# Patient Record
Sex: Male | Born: 1940
Health system: Southern US, Community
[De-identification: ages and names within clinical notes are randomized; demographics above are authoritative.]

## PROBLEM LIST (undated history)

## (undated) DIAGNOSIS — G4733 Obstructive sleep apnea (adult) (pediatric): Secondary | ICD-10-CM

## (undated) DIAGNOSIS — G4730 Sleep apnea, unspecified: Secondary | ICD-10-CM

## (undated) DIAGNOSIS — H579 Unspecified disorder of eye and adnexa: Secondary | ICD-10-CM

## (undated) DIAGNOSIS — Z87442 Personal history of urinary calculi: Secondary | ICD-10-CM

## (undated) DIAGNOSIS — Z9989 Dependence on other enabling machines and devices: Secondary | ICD-10-CM

## (undated) DIAGNOSIS — E785 Hyperlipidemia, unspecified: Secondary | ICD-10-CM

## (undated) DIAGNOSIS — M1990 Unspecified osteoarthritis, unspecified site: Secondary | ICD-10-CM

## (undated) DIAGNOSIS — J302 Other seasonal allergic rhinitis: Secondary | ICD-10-CM

## (undated) DIAGNOSIS — G4761 Periodic limb movement disorder: Secondary | ICD-10-CM

## (undated) DIAGNOSIS — I10 Essential (primary) hypertension: Secondary | ICD-10-CM

## (undated) DIAGNOSIS — R0902 Hypoxemia: Secondary | ICD-10-CM

## (undated) HISTORY — DX: Dependence on other enabling machines and devices: Z99.89

## (undated) HISTORY — DX: Hypoxemia: R09.02

## (undated) HISTORY — PX: JOINT REPLACEMENT: SHX530

## (undated) HISTORY — DX: Periodic limb movement disorder: G47.61

## (undated) HISTORY — PX: KNEE ARTHROSCOPY: SUR90

## (undated) HISTORY — DX: Obstructive sleep apnea (adult) (pediatric): G47.33

## (undated) HISTORY — DX: Unspecified disorder of eye and adnexa: H57.9

## (undated) HISTORY — PX: TOTAL HIP ARTHROPLASTY: SHX124

---

## 2002-12-10 ENCOUNTER — Encounter: Payer: Self-pay | Admitting: Specialist

## 2002-12-13 HISTORY — PX: TOTAL KNEE ARTHROPLASTY: SHX125

## 2002-12-21 ENCOUNTER — Inpatient Hospital Stay (HOSPITAL_COMMUNITY): Admission: RE | Admit: 2002-12-21 | Discharge: 2002-12-25 | Payer: Self-pay | Admitting: Specialist

## 2003-12-14 HISTORY — PX: TOTAL KNEE ARTHROPLASTY: SHX125

## 2003-12-16 ENCOUNTER — Inpatient Hospital Stay (HOSPITAL_COMMUNITY): Admission: EM | Admit: 2003-12-16 | Discharge: 2003-12-17 | Payer: Self-pay | Admitting: Emergency Medicine

## 2004-01-30 ENCOUNTER — Ambulatory Visit (HOSPITAL_COMMUNITY): Admission: RE | Admit: 2004-01-30 | Discharge: 2004-01-30 | Payer: Self-pay | Admitting: Specialist

## 2004-01-31 ENCOUNTER — Inpatient Hospital Stay (HOSPITAL_COMMUNITY): Admission: RE | Admit: 2004-01-31 | Discharge: 2004-02-04 | Payer: Self-pay | Admitting: Specialist

## 2009-08-26 ENCOUNTER — Encounter (INDEPENDENT_AMBULATORY_CARE_PROVIDER_SITE_OTHER): Payer: Self-pay | Admitting: *Deleted

## 2009-12-13 HISTORY — PX: SHOULDER ARTHROSCOPY: SHX128

## 2009-12-25 ENCOUNTER — Encounter (INDEPENDENT_AMBULATORY_CARE_PROVIDER_SITE_OTHER): Payer: Self-pay | Admitting: *Deleted

## 2009-12-31 ENCOUNTER — Ambulatory Visit: Payer: Self-pay | Admitting: Internal Medicine

## 2010-01-09 ENCOUNTER — Ambulatory Visit: Payer: Self-pay | Admitting: Internal Medicine

## 2010-10-15 ENCOUNTER — Ambulatory Visit (HOSPITAL_COMMUNITY): Admission: RE | Admit: 2010-10-15 | Discharge: 2010-10-16 | Payer: Self-pay | Admitting: Orthopedic Surgery

## 2010-12-13 HISTORY — PX: TOTAL SHOULDER ARTHROPLASTY: SHX126

## 2011-01-12 NOTE — Letter (Signed)
Summary: [facility] Instructions  [facility]  [address] [place], [place] [address]   Phone: [phone]  Fax: [phone]       Tommy [name]    1941-04-01    MRN: [id number]        Procedure Day /Date:  Friday 01/09/2010     Arrival Time:  8:30 am      Procedure Time: 9:30 am     Location of Procedure:                    _ x_  [facility] (4th Floor)  PREPARATION FOR COLONOSCOPY WITH MOVIPREP   Starting 5 days prior to your procedure Sunday 1/23  do not eat nuts, seeds, popcorn, corn, beans, peas,  salads, or any raw vegetables.  Do not take any fiber supplements (e.g. Metamucil, Citrucel, and Benefiber).  THE DAY BEFORE YOUR PROCEDURE         DATE: Thursday 1/27  1.  Drink clear liquids the entire day-NO SOLID FOOD  2.  Do not drink anything colored red or purple.  Avoid juices with pulp.  No orange juice.  3.  Drink at least 64 oz. (8 glasses) of fluid/clear liquids during the day to prevent dehydration and help the prep work efficiently.  CLEAR LIQUIDS INCLUDE: Water Jello Ice Popsicles Tea (sugar ok, no milk/cream) Powdered fruit flavored drinks Coffee (sugar ok, no milk/cream) Gatorade Juice: apple, white grape, white cranberry  Lemonade Clear bullion, consomm, broth Carbonated beverages (any kind) Strained chicken noodle soup Hard Candy                             4.  In the morning, mix first dose of MoviPrep solution:    Empty 1 Pouch A and 1 Pouch B into the disposable container    Add lukewarm drinking water to the top line of the container. Mix to dissolve    Refrigerate (mixed solution should be used within 24 hrs)  5.  Begin drinking the prep at 5:00 p.m. The MoviPrep container is divided by 4 marks.   Every 15 minutes drink the solution down to the next mark (approximately 8 oz) until the full liter is complete.   6.  Follow completed prep with 16 oz of clear liquid of your choice (Nothing red or purple).   Continue to drink clear liquids until bedtime.  7.  Before going to bed, mix second dose of MoviPrep solution:    Empty 1 Pouch A and 1 Pouch B into the disposable container    Add lukewarm drinking water to the top line of the container. Mix to dissolve    Refrigerate  THE DAY OF YOUR PROCEDURE      DATE: Friday 1/28  Beginning at 4:30 a.m. (5 hours before procedure):         1. Every 15 minutes, drink the solution down to the next mark (approx 8 oz) until the full liter is complete.  2. Follow completed prep with 16 oz. of clear liquid of your choice.    3. You may drink clear liquids until 7:30 am (2 HOURS BEFORE PROCEDURE).   MEDICATION INSTRUCTIONS  Unless otherwise instructed, you should take regular prescription medications with a small sip of water   as early as possible the morning of your procedure.    Additional medication instructions:  Do not take HCTZ morning of procedure.  OTHER INSTRUCTIONS  You will need a responsible adult at least 68 years of age to accompany you and drive you home.   This person must remain in the waiting room during your procedure.  Wear loose fitting clothing that is easily removed.  Leave jewelry and other valuables at home.  However, you may wish to bring a book to read or  an iPod/MP3 player to listen to music as you wait for your procedure to start.  Remove all body piercing jewelry and leave at home.  Total time from sign-in until discharge is approximately 2-3 hours.  You should go home directly after your procedure and rest.  You can resume normal activities the  day after your procedure.  The day of your procedure you should not:   Drive   Make legal decisions   Operate machinery   Drink alcohol   Return to work  You will receive specific instructions about eating, activities and medications before you leave.    The above instructions have been reviewed and explained to me by   Ezra Sites RN   January 68, 2011 4:56 PM     I fully understand and can verbalize these instructions _____________________________ Date _________

## 2011-01-12 NOTE — Miscellaneous (Signed)
Summary: [facility]  Clinical Lists Changes  Medications: Added new medication of MOVIPREP 100 GM  SOLR (PEG-KCL-NACL-NASULF-NA ASC-C) As per prep instructions. - Signed Rx of MOVIPREP 100 GM  SOLR (PEG-KCL-NACL-NASULF-NA ASC-C) As per prep instructions.;  #1 x 0;  Signed;  Entered by: Ezra Sites RN;  Authorized by: Hilarie Fredrickson MD;  Method used: Electronically to [facility] [address], [place], [place]  [id number], Ph: [phone], Fax: [phone] Observations: Added new observation of NKA: T (12/31/2009 16:32)    Prescriptions: MOVIPREP 100 GM  SOLR (PEG-KCL-NACL-NASULF-NA ASC-C) As per prep instructions.  #1 x 0   Entered by:   Ezra Sites RN   Authorized by:   Hilarie Fredrickson MD   Signed by:   Ezra Sites RN on 12/31/2009   Method used:   Electronically to        [facility]* (retail)       [address]       [place], [place]  [id number]       Ph: [phone]       Fax: [phone]   RxID:   [id number]

## 2011-01-12 NOTE — Procedures (Signed)
Summary: Colonoscopy  Patient: Tripton Ned Note: All result statuses are Final unless otherwise noted.  Tests: (1) Colonoscopy (COL)   COL Colonoscopy           DONE     [facility]     [address]     [place], [place]  [address]           COLONOSCOPY PROCEDURE REPORT           PATIENT:  Dimitrius, Steedman  MR#:  [id number]     BIRTHDATE:  Sep 02, 1941, 68 yrs. old  GENDER:  male           ENDOSCOPIST:  Wilhemina Bonito. Eda Keys, MD     Referred by:  Surveillance Program Recall           PROCEDURE DATE:  01/09/2010     PROCEDURE:  Surveillance Colonoscopy     ASA CLASS:  Class II     INDICATIONS:  history of polyps (index exam 08-2004 w/ one polyp     w/o path)           MEDICATIONS:   Fentanyl 75 mcg IV, Versed 7 mg IV           DESCRIPTION OF PROCEDURE:   After the risks benefits and     alternatives of the procedure were thoroughly explained, informed     consent was obtained.  Digital rectal exam was performed and     revealed no abnormalities.   The LB CF-H180AL [id number] endoscope     was introduced through the anus and advanced to the cecum, which     was identified by both the appendix and ileocecal valve, without     limitations.Time to cecum = 2:51 min.  The quality of the prep was     good, using MoviPrep.  The instrument was then slowly withdrawn     (time = 12:42 min) as the colon was fully examined.     <<PROCEDUREIMAGES>>           FINDINGS:  A normal appearing cecum, ileocecal valve, and     appendiceal orifice were identified. The ascending, hepatic     flexure, transverse, splenic flexure, descending, sigmoid colon,     and rectum appeared unremarkable.  No polyps or cancers were seen.     Retroflexed views in the rectum revealed no abnormalities.    The     scope was then withdrawn from the patient and the procedure     completed.           COMPLICATIONS:  None           ENDOSCOPIC IMPRESSION:     1) Normal colon     2) No polyps or cancers         RECOMMENDATIONS:     1) Continue current colorectal screening recommendations for     "routine risk" patients with a repeat colonoscopy in 10 years.           ______________________________     Wilhemina Bonito. Eda Keys, MD           CC:  Chilton Greathouse, MD,  The Patient           n.     eSIGNEDWilhemina Bonito. Eda Keys at 01/09/2010 10:14 AM           Einar Gip, [id number]  Note: An exclamation mark (!) indicates a result that was not dispersed into the  flowsheet. Document Creation Date: 01/09/2010 10:15 AM _______________________________________________________________________  (1) Order result status: Final Collection or observation date-time: 01/09/2010 10:09 Requested date-time:  Receipt date-time:  Reported date-time:  Referring Physician:   Ordering Physician: Fransico Setters [phone]) Specimen Source:  Source: Launa Grill Order Number: [id number] Lab site:   Appended Document: Colonoscopy    Clinical Lists Changes  Observations: Added new observation of COLONNXTDUE: 12/2019 (01/09/2010 14:05)

## 2011-02-23 LAB — APTT: aPTT: 36 seconds (ref 24–37)

## 2011-02-23 LAB — COMPREHENSIVE METABOLIC PANEL
AST: 25 U/L (ref 0–37)
Alkaline Phosphatase: 63 U/L (ref 39–117)
CO2: 30 mEq/L (ref 19–32)
Chloride: 99 mEq/L (ref 96–112)
Creatinine, Ser: 1.04 mg/dL (ref 0.4–1.5)
GFR calc Af Amer: >60 mL/min (ref >60)
GFR calc non Af Amer: >60 mL/min (ref >60)
Total Bilirubin: 0.6 mg/dL (ref 0.3–1.2)

## 2011-02-23 LAB — CBC
HCT: 48.3 % (ref 39.0–52.0)
Hemoglobin: 16.8 g/dL (ref 13.0–17.0)
MCH: 29.5 pg (ref 26.0–34.0)
MCHC: 34.8 g/dL (ref 30.0–36.0)
MCV: 84.7 fL (ref 78.0–100.0)
Platelets: 224 10*3/uL (ref 150–400)
RBC: 5.7 MIL/uL (ref 4.22–5.81)
RDW: 13.6 % (ref 11.5–15.5)
WBC: 8.5 10*3/uL (ref 4.0–10.5)

## 2011-02-23 LAB — URINALYSIS, ROUTINE W REFLEX MICROSCOPIC
Glucose, UA: NEGATIVE mg/dL
Hgb urine dipstick: NEGATIVE
Protein, ur: NEGATIVE mg/dL
Specific Gravity, Urine: 1.025 (ref 1.005–1.030)
pH: 6 (ref 5.0–8.0)

## 2011-04-30 NOTE — Op Note (Signed)
NAME:  Tommy [name], Tommy [name] NO.:  [id number]   MEDICAL RECORD NO.:  [id number]                   PATIENT TYPE:  INP   LOCATION:  0467                                 FACILITY:  [facility]   PHYSICIAN:  Erasmo Leventhal, M.D.         DATE OF BIRTH:  01/01/1941   DATE OF PROCEDURE:  02/01/2004  DATE OF DISCHARGE:                                 OPERATIVE REPORT   PREOPERATIVE DIAGNOSIS:  Right knee end-stage osteoarthritis.   POSTOPERATIVE DIAGNOSIS:  Right knee end-stage osteoarthritis.   PROCEDURE:  Right total knee arthroplasty.   SURGEON:  Erasmo Leventhal, M.D.   FIRST ASSISTANT:  Madlyn Frankel. Charlann Boxer, M.D.   SECOND ASSISTANT:  Mr. Khair Chasteen, P.A.-C.   ANESTHESIA:  Spinal epidural.   ESTIMATED BLOOD LOSS:  Less than 50 mL.   DRAINS:  Two medium Hemovac.   COMPLICATIONS:  None.   TOURNIQUET TIME:  1 hour 45 minutes at 350 mmHg.   DISPOSITION:  PACU stable.   OPERATIVE IMPLANTS:  Osteonics components, all cemented.  Size 9 femur, size  9 tibia, 2 mm flex tibial insert, and a 26 mm patella posterior-stabilized,  all cemented.   OPERATIVE DETAILS:  The patient had been counseled in the holding area, the  correct side was identified.  IV was started, antibiotics were given.  The  chart was reviewed and signed appropriately.  The correct side was  identified.  Taken to the OR and placed supine after a spinal epidural was  administered.  A Foley catheter was placed utilizing sterile technique by  the OR circulating nurse.  The right knee was examined.  He had a 7 degree  flexion contracture and flexion to 115 degrees.  He was elevated, prepped  with Duraprep, and all draped in a sterile fashion.  The other extremity was  well padded and bumped.   The right lower extremity was exsanguinated with the Esmarch, the tourniquet  was inflated to 350 mmHg.  He had a previous medial arthrotomy incision  approximately 18-20 years ago.  The skin flap  between was kept as wide as  possible.  A straight midline incision was made through the skin and  subcutaneous tissue, small veins electrocoagulated.  Medial and lateral soft  tissue flaps were developed.  Soft tissue was retracted.  A medial  parapatellar arthrotomy was performed.  A medial soft tissue release done on  the proximal medial tibia.  Found to have end-stage arthritic changes, bone-  against-bone contact.  The cruciate ligaments were resected, a starting hole  made with __________.  The canal was irrigated until the effluent was clear.  The intramedullary rod was gently placed.  I chose a 5 degree valgus cut  with a 10 mm cut off the distal femur.  The distal femur was found to be a  size #9.  Rotation marks were made and the distal femur was cut to fit a  size #9.  The meniscal remnants were removed with electrocautery.  Geniculate vessels  were coagulated.  Posterior neurovascular structures were followed and  protected throughout the entire case.  Large osteophytes across the medial  tibia with eburnated bone on the medial tibial plateau.  The tibial eminence  was resected.  The proximal tibia was found to be a size 9.  A starting hole  was made.  The step drill was utilized.  The canal was irrigated until the  effluent was clear.  The intramedullary rod was gently placed.  Chose a 0  degree slope with a 10 mm cut based upon the lateral side.  The proximal  tibia was cut at this time.   Posteromedial and posterolateral femoral osteophytes were removed under  direct visualization.  The femoral trochlea was prepared in standard  fashion.  At this time the size 9 femur, size 9 tibia, with a 10 mm flex  insert, excellent range of motion and soft tissue balance.  Rotation marks  were made.  The delta keel was performed in standard fashion.  Also note  that osteophytes were removed from the proximal medial tibia.  The patella  was found to be a size 26, reamed to the  appropriate level and excess bone  was removed.  At this time the knee was irrigated with pulsatile lavage  copiously.  Utilizing Modern cement technique, all components were cemented  into place, a size 9 tibia, size 9 femur, with a 26 mm patella after the  cement had cured with a 12 mm flex insert.  We had excellent range of  motion.  It was well-balanced in flexion and extension.  Patellofemoral  tracking was anatomic and did not require a lateral release.  At this time  the tibial trial was removed, bone wax placed on the exposed bony surfaces.  Excess cement had been removed earlier and a final 12 mm flex tibial  component was placed, posterior-stabilized.  The knee was well-balanced.  We  were very pleased at this time.  The knee joint, and also the soft tissue  was irrigated with antibiotic solution during the closure.  Two medium  Hemovac drains were placed.  The arthrotomy was closed with Vicryl and subcu  Vicryl.  Skin closed with subcuticular Monocryl sutures and Steri-Strips  were applied, the drain hooked to suction, sterile compressive dressing  applied.  The tourniquet was deflated and another gram of Ancef was given  intravenously.  Sponge and needle count were correct.  There were no  complications, and the patient was gently awakened and taken from the  operating room to PACU in stable condition.  Sponge and needle count were  correct.  No complications.                                               Erasmo Leventhal, M.D.    RAC/MEDQ  D:  01/31/2004  T:  02/01/2004  Job:  [id number]

## 2011-04-30 NOTE — H&P (Signed)
NAME:  Tommy [name], Tommy [name] NO.:  [id number]   MEDICAL RECORD NO.:  [id number]                   PATIENT TYPE:  INP   LOCATION:  0467                                 FACILITY:  [facility]   PHYSICIAN:  Erasmo Leventhal, M.D.         DATE OF BIRTH:  05/03/1941   DATE OF ADMISSION:  01/31/2004  DATE OF DISCHARGE:  02/04/2004                                HISTORY & PHYSICAL   This is a redictation of a lost history and physical from the chart.   REASON FOR ADMISSION:  Total knee arthroplasty, right knee.   BRIEF HISTORY:  This is a 70 year old gentleman with a long history of  osteoarthritis in both knees.  He has undergone previous left total knee  replacement and done well and is now scheduled for right total knee  replacement due to failure of conservative measures.  The surgery, risks,  benefits and __________ were discussed in detail with the patient and  questions were invited and answered. Surgery to go ahead as scheduled.   PAST MEDICAL HISTORY:  Drug allergies:  None.   CURRENT MEDICATIONS:  1. Norvasc 5 mg q.d.  2. Benazepril hydrochlorothiazide 40 mg 1 q.i.d.  3. Effexor 150 mg q.d.  4. Zocor 40 mg q.d.   PAST SURGICAL HISTORY:  Left total knee arthroplasty and right knee  meniscectomy.   SERIOUS MEDICAL ILLNESSES:  1. Hypertension.  2. Hypercholesterolemia.   FAMILY HISTORY:  Negative.   SOCIAL HISTORY:  The patient is married and has four children, works as a  [employer] and does not smoke or drink.   REVIEW OF SYMPTOMS:  HEENT:  Negative.  PULMONARY:  Negative.  CARDIOVASCULAR:  Negative.  GI:  Negative.  GU:  Positive for history of  kidney stones.   PHYSICAL EXAMINATION:  GENERAL:  This is a well-developed, well-nourished  gentleman in no acute distress.  VITAL SIGNS:  Blood pressure 138/85, respirations 16, heart rate 86,  temperature 97.9.  HEENT:  Head normocephalic, nose patent, ears patent.  Pupils equal round  and  reactive to light. Throat without injection.  NECK:  Supple without adenopathy. Carotids 2+ without bruit.  CHEST:  Clear to auscultation, no rales or rhonchi, respirations 86 beats  per minute.  ABDOMEN:  Soft with active bowel sounds, no mass or organomegaly.  NEUROLOGIC:  The patient alert and oriented to person, place and time.  Cranial nerves II-XII are grossly intact.  EXTREMITIES:  Left knee status post total knee arthroplasty, right knee with  varus deformity. Neurovascular status is intact.  X-rays show end-stage  osteoarthritis.   ASSESSMENT:  End-stage osteoarthritis, right knee.   PLAN:  Total knee replacement, right knee.     Jaquelyn Bitter. Chabon, P.A.                   Erasmo Leventhal, M.D.    SJC/MEDQ  D:  02/28/2004  T:  02/29/2004  Job:  [id number] 

## 2011-04-30 NOTE — Discharge Summary (Signed)
NAME:  Tommy [name], Tommy [name] NO.:  [id number]   MEDICAL RECORD NO.:  [id number]                   PATIENT TYPE:  INP   LOCATION:  0467                                 FACILITY:  [facility]   PHYSICIAN:  Erasmo Leventhal, M.D.         DATE OF BIRTH:  1941-10-06   DATE OF ADMISSION:  01/31/2004  DATE OF DISCHARGE:  02/04/2004                                 DISCHARGE SUMMARY   ADMISSION DIAGNOSIS:  Osteoarthritis right knee.   Dictation ended here.     Jaquelyn Bitter. Chabon, P.A.                   Erasmo Leventhal, M.D.    SJC/MEDQ  D:  02/28/2004  T:  02/29/2004  Job:  [id number]

## 2011-04-30 NOTE — H&P (Signed)
NAME:  Tommy [name], Tommy [name] NO.:  [id number]   MEDICAL RECORD NO.:  [id number]                   PATIENT TYPE:  INP   LOCATION:  NA                                   FACILITY:  [facility]   PHYSICIAN:  Erasmo Leventhal, M.D.         DATE OF BIRTH:  1941-09-30   DATE OF ADMISSION:  12/21/2001  DATE OF DISCHARGE:                                HISTORY & PHYSICAL   REASON FOR ADMISSION:  Total knee arthroplasty on December 21, 2002.   CHIEF COMPLAINT:  Left knee osteoarthritis.   HISTORY OF PRESENT ILLNESS:  This is a 70 year old gentleman with a long  history of osteoarthritis in his left and right knees, left worse than  right. He has had conservative treatment over the years with knee sleeve  therapy, injections, all of which have only given temporary relief in his  discomfort. Due to his increasing varus deformity and pain, the patient  requests total knee replacement. After a discussion of treatment options,  the patient is now for total knee replacement of the left knee. The surgery,  risks, benefits and aftercare, including infection, loosening, nerve or  blood vessel damage, phlebitis and death were discussed with the patient. He  requests to proceed with surgery and we will proceed with same.   ALLERGIES:  No known drug allergies.   CURRENT MEDICATIONS:  1. Effexor 150 mg 1 q.d.  2. Lotensin 40 mg 1 q.d.  3. Norvasc 5 mg 1 q.d.  4. Zocor 40 mg 1 q.d.   PAST SURGICAL HISTORY:  Right knee meniscectomy.   PAST MEDICAL HISTORY:  1. Hypertension.  2. Hypercholesteremia.   FAMILY HISTORY:  Negative.   SOCIAL HISTORY:  The patient is married. He has four children. He works as a  [employer]al architect. He does not smoke or drink.   REVIEW OF SYSTEMS:  HEENT: Negative for blurred vision or dizziness.  RESPIRATORY:  Negative for any PND or orthopnea. CARDIOVASCULAR:  Negative  for chest pain or palpitations. Positive for hypertension. GI:  Negative for  ulcers or hepatitis. GU:  Positive for a history of kidney stones.   PHYSICAL EXAMINATION:  VITAL SIGNS:  Blood pressure 130/90, respirations 18,  pulse 80 and regular.  HEENT:  Normocephalic. Nose patent. Pupils equally round and reactive to  light. Throat without injection.  NECK:  Supple without adenopathy. Carotids 2+ without bruits.  CHEST: Clear to auscultation, no rales or rhonchi, respirations 18.  HEART:  Regular rate and rhythm at 80 beats per minute without murmur.  ABDOMEN:  Soft with normoactive bowel sounds, no masses or organomegaly.  NEUROLOGIC:  The patient was  alert and oriented to time, place and person.  Cranial nerves II through XII grossly intact.  EXTREMITIES:  Within normal limits with the exception of the knees which  have varus deformities bilaterally, left greater than right. The left knee  has about a 3 degree flexion contracture with  further flexion to 120  degrees. He has pain with weightbearing and medial joint line pain. There is  crepitation throughout the range of motion.  Dorsalis pedis pulses and  posterior tibialis pulses are 2+ and sensibility is normal.   LABORATORY DATA:  X-rays revealed DJD with a varus deformity.   ASSESSMENT:  Degenerative joint disease, left knee.   PLAN:  Total knee replacement, left knee.                                               Erasmo Leventhal, M.D.    RAC/MEDQ  D:  12/12/2002  T:  12/12/2002  Job:  [id number]

## 2011-04-30 NOTE — Discharge Summary (Signed)
NAME:  ROBEN, SCHLIEP NO.:  [id number]   MEDICAL RECORD NO.:  [id number]                   PATIENT TYPE:  INP   LOCATION:  0467                                 FACILITY:  [facility]   PHYSICIAN:  Erasmo Leventhal, M.D.         DATE OF BIRTH:  [birth year]   DATE OF ADMISSION:  01/31/2004  DATE OF DISCHARGE:  02/04/2004                                 DISCHARGE SUMMARY   ADMITTING DIAGNOSIS:  End-stage osteoarthritis right knee.   DISCHARGE DIAGNOSIS:  End-stage osteoarthritis right knee.   OPERATION:  Total knee arthroplasty right knee.   BRIEF HISTORY:  This is a 70 year old gentleman with end-stage  osteoarthritis of his right knee who has failed conservative measures.  After discussion of the treatment options the patient is scheduled for a  total knee arthroplasty of the right knee.  Surgery risks, benefits, and  aftercare were discussed with the patient.  Surgery to go ahead as  scheduled.   LABORATORY VALUES:  Admission CBC within normal limits, admission PTT within  normal limits, hemoglobin/hematocrit reached a low of 13.1 and 37.6 on  February 03, 2004, PT reached 18.7 with an INR of 1.9 at discharge,  admission CMET within normal limits, he did have one episode of his glucose  reaching 155 otherwise normal, urinalysis did show some casts (both hyaline  and granular) and calcium oxalate crystals on January 28, 2004, repeat  urinalysis was normal.   COURSE IN THE HOSPITAL:  Patient tolerated the operative procedure well.  First postoperative day he had some pain but appeared comfortable, his vital  signs were stable, temperature 99.1, he did have elevated blood pressure at  180/106 but this was taken before he was restarted on his blood pressure  medication.  Second postoperative day his epidural was removed, his vital  signs were stable, pain was controlled, temperature was to a maximum of  100.4, hemoglobin and hematocrit were stable, he  was started on CPM and  physical therapy.  Third postoperative day he was feeling good, vital signs  were stable, temperature to a max of 100.5, dressing was changed, wounds  were benign, casts were negative, lungs were clear, heart sounds were  normal, bowel sounds active.  Fourth postoperative day vital signs remained  stable, temperature was 98.9, PT was 18.7 and INR 1.9, urine cultures were  negative, dressing was changed, wounds were benign, calf was nontender, and  the patient was subsequently discharged home for follow up in the office.   CONDITION ON DISCHARGE:  Improved.   DISCHARGE MEDICATIONS:  1. Robaxin 500 one p.o. q.8h. p.r.n. spasm.  2. Percocet one q.4-6h. p.r.n. pain.  3. Trinsicon one once daily.  4. Coumadin per pharmacy protocol.   DISCHARGE INSTRUCTIONS:  He is to do his home physical therapy, use his home  CPM, use his walker as instructed and return to see [place] in 10 days or sooner  p.r.n. problems.  Jaquelyn Bitter. Chabon, P.A.                   Erasmo Leventhal, M.D.    SJC/MEDQ  D:  02/19/2004  T:  02/20/2004  Job:  [id number]

## 2011-04-30 NOTE — Discharge Summary (Signed)
NAME:  Tommy [name], OAXACA NO.:  [id number]   MEDICAL RECORD NO.:  [id number]                   PATIENT TYPE:  INP   LOCATION:  0454                                 FACILITY:  [facility]   PHYSICIAN:  Erasmo Leventhal, M.D.         DATE OF BIRTH:  01/09/1941   DATE OF ADMISSION:  12/21/2002  DATE OF DISCHARGE:  12/25/2002                                 DISCHARGE SUMMARY   ADMISSION DIAGNOSIS:  Left knee osteoarthritis.   DISCHARGE DIAGNOSIS:  Left knee osteoarthritis.   OPERATION:  Total knee replacement left knee.   BRIEF HISTORY:  This is a 70 year old gentleman, long history of  osteoarthritis of the left and right knees, left worse than right.  He had  conservative treatment over the years with knee sweep therapy, injections,  all of which have only given him temporary relief of his discomfort.  Due to  increasing varus deformity and pain, the patient requests total knee  replacement.  After discussion of the treatment options the patient is  scheduled for total knee replacement.  The surgery, risks, benefits, and  aftercare were discussed in detail with the patient, and surgery is  scheduled.   LABORATORY VALUES:  Admission CBC within normal limits.  Admission complete  metabolic within normal limits.  The CBC reached a low of hemoglobin 12.9,  hematocrit 37.1 on December 23, 2002, and was back to 80 and 38 on December 24, 2002.  Discharge PT was 22.4 with an INR of 2.3.  Discharge BMET was  within normal limits with the exception of the glucose which was high at  141.   COURSE IN THE HOSPITAL:  The patient tolerated the operative procedure well.  First postoperative day vital signs were stable, he was afebrile.  He had  good motion in the knee.  Hemovac was pulled.  Hemoglobin and hematocrit  were stable.  The patient was doing well with pain control.  His epidural  was not keeping his pain under control and was subsequently pulled, and he  was switched to PCA and morphine.  His CPM was started, and he was to start  the Coumadin protocol that afternoon.  Postoperative day #2 vital signs were  stable, temperature was to 101.  No complaints.  Good appetite.  Hemoglobin  and hematocrit were stable.  Dressing was changed, and the wound was intact  and without difficulties.  He continued in therapy and CPM.  Postoperative  day #3 his vital signs were stable, he was afebrile.  Neurovascular status  was normal in the leg with normal circulation and sensation.  The wound was  benign.  Dressings were changed.  No calf tenderness or cords were noted.  The patient was up in PT and used the CPM.  On the fourth postoperative day  the patient was feeling good.  Vital signs were stable.  He was afebrile,  but the temperature was to 100.2 the  previous evening.  INR was 2.3 with PT  22.4.  The wound was slightly red at the distal margin.  There was no calf  tenderness or swelling, and the patient was subsequently ready for discharge  after set up of home PT, home health, and home monitoring of PT for  Coumadin.   CONDITION ON DISCHARGE:  Improved.   DISCHARGE MEDICATIONS:  1. Percocet 1-2 q.6h. p.r.n. pain.  2. Robaxin 500, 1 p.o. q.8h. p.r.n. spasm.  3. Keflex 250, 1 p.o. q.i.d. for five days.  4. Coumadin per pharmacy protocol.   DISCHARGE INSTRUCTIONS:  He is to undergo therapy at home, use his crutches.  Take no aspirin products.  Call if temperature or if wound showed any  redness or difficulties and/or return to the office in 10 days for  reevaluation or see [place] soon p.r.n. problems.     Jaquelyn Bitter. Chabon, P.A.                   Erasmo Leventhal, M.D.    SJC/MEDQ  D:  02/01/2003  T:  02/01/2003  Job:  [id number]

## 2011-04-30 NOTE — Op Note (Signed)
NAME:  Tommy [name], Tommy [name] NO.:  [id number]   MEDICAL RECORD NO.:  [id number]                   PATIENT TYPE:  INP   LOCATION:  0003                                 FACILITY:  [facility]   PHYSICIAN:  Erasmo Leventhal, M.D.         DATE OF BIRTH:  09-15-1941   DATE OF PROCEDURE:  12/21/2002  DATE OF DISCHARGE:                                 OPERATIVE REPORT   PREOPERATIVE DIAGNOSIS:  Left knee end-stage osteoarthritis.   POSTOPERATIVE DIAGNOSIS:  Left knee end-stage osteoarthritis.   PROCEDURE:  Left total knee arthroplasty.   SURGEON:  Erasmo Leventhal, M.D.   ASSISTANT:  Oneita Hurt, PA-C   ANESTHESIA:  Epidural with IV sedation.   ESTIMATED BLOOD LOSS:  Less than 100 cc.   DRAINS:  Two medium Hemovacs.   COMPLICATIONS:  None.   TOURNIQUET TIME:  1 hour and 55 minutes at 350 mmHg.   OPERATIVE IMPLANTS:  Osteonics components all cemented. Size 9 femur, size  11 tibia, 10 mm flex-type tibial insert with a 26-mm patella.   DESCRIPTION OF PROCEDURE:  The patient was counseled in the holding area,  correct sizes identified, IV antibiotics given. Taken to the OR where the  epidural was administered. A Foley catheter was placed utilizing sterile  technique by the OR circulating nurse. The left lower extremity was  examined. About 8 degrees of varus with a 5 degree flexion contracture and  flexed to 125 degrees. He was elevated, prepped with Duraprep, draped in a  sterile fashion. The leg was exsanguinated, tourniquet inflated to 350 mmHg.   A standard straight midline incision made in the skin and subcutaneous  tissue. Medial and lateral soft tissue flaps were developed at the  appropriate level. I made a soft tissue release from the proximal medial  tibia. The patella was everted, knee was flexed, end-stage arthritic  changes, bone against bone contact. The cruciate ligaments were resected. A  starting hole made in the distal femur,  irrigated, intermedullary rod was  gently placed. I chose a 5 degree valgus cut with a 10 mm cut off the distal  femur. The distal femur was found to be a size #9, replacement marks were  made and distal femur was cut to fit a size #9.   Medial and lateral osteophytes were removed from the tibia. The medial and  lateral menisci removed, geniculate vessels were coagulated, tibial eminence  was resected, proximal tibia was found to be a size #11. A starting hole was  made, step reamer utilized and a copious irrigation of the intermedullary  canal was done to prevent fat embolization. The intermedullary rod was  gently placed. I chose initially a 10 mm cut based upon the lateral side  which went in the direction of the defect on the medial side. The medial  postoperative osteophytes removed under direct visualization. The femoral  trochlea was prepared in standard fashion.   At this  time, a trial with a 9 femur, 11 tibia with a 10 mm insert was a  little bit tight in flexion extension, therefore these were removed and  another 2 mm taken from the proximal tibia. At this time with the 10 mm  insert, a 9 femur and 11 tibia we had excellent range of motion, well  balance in flexion extension and patella tracking was anatomic.   Rotation marks were made, alignment was excellent. Rotational marks were  made and a delta keel was performed.   The patella was found to be a size #26, it was of appropriate thickness. It  ranged to a depth of 10 mm, locking holes were made and excess bone was  removed. The knee was then irrigated with pulsatile lavage.   Utilizing modern cement technique, all components were then cemented into  place, a size 11 tibia, size 9 femur with a 26 patella. After the cement had  cured, all excess cement was removed. With a 10 mm flex-type trial, we had  excellent flexion-extension gaps, well balanced, patella tracking was  anatomic. The trial was removed, irrigated  thoroughly and the final implant  was a 10 mm Intraflex insert was placed. The geniculate vessel was  coagulated, bone wax placed to expose bony surfaces. Patella tracking was  anatomic.   Each side of the knee was irrigated with antibiotic solution during the  closure. The synovium closed with Vicryl, extensor mechanism was closed with  a Vicryl suture and the quadriceps tendon on two layers. The subcu closed  with Vicryl, skin closed with subcuticular Monocryl suture. Two medium  Hemovac drains placed prior to closure of the knee joint. Steri-Strips were  applied, sterile compressive dressing applied, tourniquet deflated and had  normal circulation in foot and ankle at the end of the case.  5 mg of IV  Ancef was given intravenously. There were no complications. Sponge and  needle count were correct. Ice pack and knee immobilizer applied. He was  taken from the operating room to PACU in stable condition. There were no  complications. Sponge and needle count were correct.   To decrease surgical time and help with retraction, Mr. Guerry Bruin  assisted me throughout this case.                                               Erasmo Leventhal, M.D.    RAC/MEDQ  D:  12/21/2002  T:  12/21/2002  Job:  [id number]

## 2011-10-04 ENCOUNTER — Encounter (HOSPITAL_COMMUNITY)
Admission: RE | Admit: 2011-10-04 | Discharge: 2011-10-04 | Disposition: A | Discharge status: Home / Self Care | Payer: BC Managed Care – PPO | Source: Ambulatory Visit | Attending: Orthopedic Surgery | Admitting: Orthopedic Surgery

## 2011-10-04 LAB — COMPREHENSIVE METABOLIC PANEL
Albumin: 4 g/dL (ref 3.5–5.2)
Alkaline Phosphatase: 67 U/L (ref 39–117)
BUN: 11 mg/dL (ref 6–23)
Creatinine, Ser: 0.96 mg/dL (ref 0.50–1.35)
GFR calc Af Amer: >90 mL/min (ref >90)
Glucose, Bld: 121 mg/dL — ABNORMAL HIGH (ref 70–99)
Total Bilirubin: 0.4 mg/dL (ref 0.3–1.2)
Total Protein: 7.5 g/dL (ref 6.0–8.3)

## 2011-10-04 LAB — URINE MICROSCOPIC-ADD ON

## 2011-10-04 LAB — SURGICAL PCR SCREEN: Staphylococcus aureus: POSITIVE — AB

## 2011-10-04 LAB — URINALYSIS, ROUTINE W REFLEX MICROSCOPIC
Leukocytes, UA: NEGATIVE
Nitrite: NEGATIVE
Specific Gravity, Urine: 1.021 (ref 1.005–1.030)
Urobilinogen, UA: 1 mg/dL (ref 0.0–1.0)
pH: 5.5 (ref 5.0–8.0)

## 2011-10-04 LAB — TYPE AND SCREEN
ABO/RH(D): O POS
Antibody Screen: NEGATIVE

## 2011-10-04 LAB — CBC
HCT: 47 % (ref 39.0–52.0)
Hemoglobin: 16.3 g/dL (ref 13.0–17.0)
MCV: 87.2 fL (ref 78.0–100.0)
RBC: 5.39 MIL/uL (ref 4.22–5.81)
WBC: 6.8 10*3/uL (ref 4.0–10.5)

## 2011-10-04 LAB — PROTIME-INR
INR: 0.98 (ref 0.00–1.49)
Prothrombin Time: 13.2 seconds (ref 11.6–15.2)

## 2011-10-07 ENCOUNTER — Inpatient Hospital Stay (HOSPITAL_COMMUNITY)
Admission: RE | Admit: 2011-10-07 | Discharge: 2011-10-08 | DRG: 491 | Disposition: A | Discharge status: Home / Self Care | Payer: BC Managed Care – PPO | Source: Ambulatory Visit | Attending: Orthopedic Surgery | Admitting: Orthopedic Surgery

## 2011-10-07 DIAGNOSIS — Z96659 Presence of unspecified artificial knee joint: Secondary | ICD-10-CM

## 2011-10-07 DIAGNOSIS — I10 Essential (primary) hypertension: Secondary | ICD-10-CM | POA: Diagnosis present

## 2011-10-07 DIAGNOSIS — Z79899 Other long term (current) drug therapy: Secondary | ICD-10-CM

## 2011-10-07 DIAGNOSIS — Z01812 Encounter for preprocedural laboratory examination: Secondary | ICD-10-CM

## 2011-10-07 DIAGNOSIS — Z96619 Presence of unspecified artificial shoulder joint: Secondary | ICD-10-CM

## 2011-10-07 DIAGNOSIS — G4733 Obstructive sleep apnea (adult) (pediatric): Secondary | ICD-10-CM | POA: Diagnosis present

## 2011-10-07 DIAGNOSIS — M19019 Primary osteoarthritis, unspecified shoulder: Principal | ICD-10-CM | POA: Diagnosis present

## 2011-10-12 NOTE — Op Note (Signed)
NAME:  KAPENA, HAMME NO.:  [id number]  MEDICAL RECORD NO.:  [id number]  LOCATION:  2899                         FACILITY:  [facility]  PHYSICIAN:  Vania Rea. Kendle Turbin, M.D.  DATE OF BIRTH:  06/10/1941  DATE OF PROCEDURE:  10/07/2011 DATE OF DISCHARGE:                              OPERATIVE REPORT   PREOPERATIVE DIAGNOSIS:  End-stage left shoulder glenohumeral osteoarthrosis.  POSTOPERATIVE DIAGNOSIS:  End-stage left shoulder glenohumeral osteoarthrosis.  PROCEDURE:  Left total shoulder arthroplasty utilizing a press-fit size 12 [employer] stem, a 48 pegged glenoid cemented and a 52 x 18 eccentric humeral head.  SURGEON:  Vania Rea. Trampus Mcquerry, MD  ASSISTANT:  Lucita Lora. Shuford, PA-C  ANESTHESIA:  General endotracheal as well as an interscalene block.  ESTIMATED BLOOD LOSS:  Approximately 200 mL.  DRAINS:  None.  HISTORY:  Mr. Hollerbach is a 70 year old gentleman who has had long- standing difficulties with both shoulders secondary to end-stage osteoarthritis status post a right total shoulder arthroplasty that we performed a number of months ago.  He now returns with ongoing difficulties related to his left shoulder end-stage gonarthrosis and today's plan for left total shoulder arthroplasty.  Preoperatively I counseled Mr. Bagnall on treatment options as well as risks versus benefits thereof.  Possible surgical complications were reviewed including potential for bleeding, infection, neurovascular injury, persistent pain, loss of motion, anesthetic complication, possible need for additional surgery.  He understands and accepts and agrees to planned procedure.  PROCEDURE IN DETAIL:  After undergoing routine preoperative evaluation, the patient received appropriate antibiotics and interscalene block was established in the holding area of the Anesthesia Department.  Placed supine on the operative table, underwent smooth induction of a general endotracheal  anesthesia.  Placed in beach-chair position and appropriately padded and protected.  Left shoulder girdle region was sterilely prepped and draped in standard fashion.  Time-out was called. Of note, Ralene Bathe, PA-C, surgical assistant throughout this case for assistance with the positioning of the extremity, holding retractors, exposure, suture management, wound closure, and intraoperative decision making was a vital part of the success of this procedure.  Anterior approach to the left shoulder was made to the deltopectoral interval.  Incision approximately 15 cm in length.  Dissection carried deeply and electrocautery was used for hemostasis.  The deltopectoral interval was identified with cephalic vein retracted laterally to the deltoid and this was developed from proximal to distal from the coracoid process down to the major tendon, the upper 1.5 cm which tenotomized to improve visualization.  Conjoined tendon was then mobilized, placed a self-retaining retractors to maintain the interval.  The biceps tendon was then elevated from the bicipital groove, tenotomized for later tenodesis.  We then electrocoagulated the anterior humeral circumflex vessels and then performed a tenotomy of the subscapularis leaving a lateral cuff approximately 2 cm for later repair, and also split the rotator interval to the base of the coracoid.  The free margin of the subscapularis was then tagged with a series of #2 FiberWire grasping sutures for later repair.  We then mobilized the subscapularis and retracted it medially.  The humeral head was then delivered through the wound and we divided the anterior inferior  capsule past the 6 o'clock back to the 8 o'clock position to allow complete delivery of the humeral head.  Using the extramedullary guide, we then outlined our proposed humeral head resection using oscillating saw to divide the humeral head, taking care to protect the rotator cuff.  Head was  then measured approximately 52 diameter.  We then removed the anterior and inferior osteophytes from the humeral head and neck.  Rotator cuff was found to be intact superiorly and posteriorly.  At this point, a series of hand reamings was performed opening the humeral canal up to size 12 and then broached up to size 12 maintaining approximately 30 degrees of retroversion.  Overall construct and alignment to our satisfaction.  We then placed a metal cap over the cut surface of the proximal humerus and then we went ahead and exposed the glenoid using a Fukuda retractor posteriorly, pitchfork anteriorly, and snake tongue superiorly and inferiorly as needed.  We are able to perform a circumferential labral excision with removal of the proximal stump of the biceps as well, gained complete exposure of the circumference of the glenoid.  This was measured with a 48 and provided the best coverage.  Central guide pin was then placed.  We performed a reaming with 48 reamer down to subchondral bone, and then the central drill hole was placed.  We then used the proper guide to place the peripheral peg holes overall as much to our satisfaction.  Trial glenoid showed excellent fit.  At this point, the glenoid was irrigated.  Cement was mixed and at the appropriate consistency, introduced into the peripheral peg holes and the final glenoid was impacted into position obtaining excellent fit and purchase.  Cement was allowed to harden.  At this point, we then carefully re-delivered the humeral head to the wound and implanted our size 12 humeral stem, which obtained excellent bony purchase and fixation.  We then performed trial reductions and the 52 x 18 eccentric head had the best coverage of the proximal humerus and this was what we ultimately selected and this did allow proper excursion of the humeral head across the glenoid.  The final 52 x 18 eccentric was impacted after the [facility] taper was  meticulously cleaned.  Final reduction was performed and overall soft tissue balance and mobility of the shoulder was very much to our satisfaction.  At this point, the subscapularis was then repaired to the lesser tuberosity through the residual soft tissue stump that had been carefully preserved and this was performed utilizing #2 FiberWires and overall soft tissue balance was much to our satisfaction, and we copiously irrigated joint prior to the final closure.  We then reclosed the rotator interval superiorly with figure-of-eight #2 FiberWires.  The shoulder was then taken through range of motion showing excellent mobility with easy external rotation to 30 degrees.  At this point, the deltopectoral interval was then irrigated to allow to reapproximate and closed with 2-0 Vicryl in the subcu layer and intracuticular 3-0 Monocryl for the skin followed by Steri-Strips.  Dry dressing was then taped to the left shoulder.  Left arm was placed in a sling immobilizer.  The patient was then awakened, extubated, and taken to the recovery room in a stable condition.     Vania Rea. Lavona Norsworthy, M.D.     KMS/MEDQ  D:  10/07/2011  T:  10/07/2011  Job:  [id number]  Electronically Signed by Francena Hanly M.D. on 10/12/2011 05:05:22 PM

## 2013-01-20 ENCOUNTER — Encounter (HOSPITAL_COMMUNITY): Payer: Self-pay | Admitting: Emergency Medicine

## 2013-01-20 ENCOUNTER — Emergency Department (HOSPITAL_COMMUNITY): Payer: Worker's Compensation

## 2013-01-20 ENCOUNTER — Emergency Department (HOSPITAL_COMMUNITY)
Admission: EM | Admit: 2013-01-20 | Discharge: 2013-01-20 | Disposition: A | Discharge status: Home / Self Care | Payer: Worker's Compensation | Attending: Emergency Medicine | Admitting: Emergency Medicine

## 2013-01-20 DIAGNOSIS — S2249XA Multiple fractures of ribs, unspecified side, initial encounter for closed fracture: Secondary | ICD-10-CM | POA: Insufficient documentation

## 2013-01-20 DIAGNOSIS — W1789XA Other fall from one level to another, initial encounter: Secondary | ICD-10-CM | POA: Insufficient documentation

## 2013-01-20 DIAGNOSIS — Z87891 Personal history of nicotine dependence: Secondary | ICD-10-CM | POA: Insufficient documentation

## 2013-01-20 DIAGNOSIS — S59909A Unspecified injury of unspecified elbow, initial encounter: Secondary | ICD-10-CM | POA: Insufficient documentation

## 2013-01-20 DIAGNOSIS — Y939 Activity, unspecified: Secondary | ICD-10-CM | POA: Insufficient documentation

## 2013-01-20 DIAGNOSIS — Z79899 Other long term (current) drug therapy: Secondary | ICD-10-CM | POA: Insufficient documentation

## 2013-01-20 DIAGNOSIS — Z96619 Presence of unspecified artificial shoulder joint: Secondary | ICD-10-CM | POA: Insufficient documentation

## 2013-01-20 DIAGNOSIS — S2232XA Fracture of one rib, left side, initial encounter for closed fracture: Secondary | ICD-10-CM

## 2013-01-20 DIAGNOSIS — I10 Essential (primary) hypertension: Secondary | ICD-10-CM | POA: Insufficient documentation

## 2013-01-20 DIAGNOSIS — Y929 Unspecified place or not applicable: Secondary | ICD-10-CM | POA: Insufficient documentation

## 2013-01-20 DIAGNOSIS — S6990XA Unspecified injury of unspecified wrist, hand and finger(s), initial encounter: Secondary | ICD-10-CM | POA: Insufficient documentation

## 2013-01-20 DIAGNOSIS — L989 Disorder of the skin and subcutaneous tissue, unspecified: Secondary | ICD-10-CM | POA: Insufficient documentation

## 2013-01-20 HISTORY — DX: Essential (primary) hypertension: I10

## 2013-01-20 HISTORY — DX: Sleep apnea, unspecified: G47.30

## 2013-01-20 MED ORDER — KETOROLAC TROMETHAMINE 60 MG/2ML IM SOLN
60.0000 mg | Freq: Once | INTRAMUSCULAR | Status: AC
  Administered 2013-01-20: 60 mg via INTRAMUSCULAR
  Filled 2013-01-20: qty 2

## 2013-01-20 MED ORDER — IBUPROFEN 800 MG PO TABS: 800.0000 mg | ORAL_TABLET | Freq: Three times a day (TID) | ORAL | Status: DC

## 2013-01-20 MED ORDER — OXYCODONE-ACETAMINOPHEN 5-325 MG PO TABS: 2.0000 | ORAL_TABLET | ORAL | Status: DC | PRN

## 2013-01-20 NOTE — ED Notes (Signed)
Pt fell off bed of flat bed truck yesterday, about 4 ft., landing on concrete surface. No loss of consciousness. Pt here today d/t left sided rib and side pain. Both left extremities are sore but has good ROM.

## 2013-01-20 NOTE — ED Provider Notes (Signed)
History     CSN: [id number]  Arrival date & time 01/20/13  1545   First MD Initiated Contact with Patient 01/20/13 1659      Chief Complaint  Patient presents with  . Fall    (Consider location/radiation/quality/duration/timing/severity/associated sxs/prior treatment) The history is provided by the patient.  Tommy [name] is a 72 y.o. male history of hypertension, bilateral shoulder surgeries in the past here with status post fall. He was on his truck yesterday and fell about 4 feet onto a concrete surface and landed on his left side. No head injury or LOC. He said the pain got worse this morning so he came in. He has more pain in the left ribs as well as his elbows. No nausea vomiting or abdominal pain.    Past Medical History  Diagnosis Date  . Hypertension   . Sleep apnea     Past Surgical History  Procedure Laterality Date  . Joint replacement      bilateral knee replacemts  . Joint replacement      bilateral shoulder replacemts    No family history on file.  History  Substance Use Topics  . Smoking status: Former Smoker    Quit date: 01/20/1983  . Smokeless tobacco: Never Used  . Alcohol Use: No      Review of Systems  Musculoskeletal:       L rib pain and elbow pain   All other systems reviewed and are negative.    Allergies  Review of patient's allergies indicates no known allergies.  Home Medications   Current Outpatient Rx  Name  Route  Sig  Dispense  Refill  . amLODipine (NORVASC) 10 MG tablet   Oral   Take 10 mg by mouth every morning.         Marland Kitchen atorvastatin (LIPITOR) 20 MG tablet   Oral   Take 20 mg by mouth every evening.         . benazepril (LOTENSIN) 40 MG tablet   Oral   Take 40 mg by mouth every morning.         . fluticasone (FLONASE) 50 MCG/ACT nasal spray   Nasal   Place 2 sprays into the nose daily.         Marland Kitchen venlafaxine XR (EFFEXOR-XR) 75 MG 24 hr capsule   Oral   Take 225 mg by mouth every morning.            BP 139/89  Pulse 82  Temp(Src) 98.7 F (37.1 C) (Oral)  Resp 22  SpO2 95%  Physical Exam  Nursing note and vitals reviewed. Constitutional: He is oriented to person, place, and time. He appears well-developed and well-nourished.  HENT:  Head: Normocephalic and atraumatic.  Mouth/Throat: Oropharynx is clear and moist.  Eyes: Conjunctivae are normal. Pupils are equal, round, and reactive to light.  Neck: Normal range of motion. Neck supple.  No midline tenderness. C spine cleared clinically.   Cardiovascular: Normal rate, regular rhythm and normal heart sounds.   Pulmonary/Chest: Effort normal and breath sounds normal. No respiratory distress. He has no wheezes. He has no rales.  No bruise. + L sided rib tenderness.   Abdominal: Soft. Bowel sounds are normal. He exhibits no distension. There is no tenderness. There is no rebound.  Musculoskeletal: Normal range of motion.  L shoulder nl ROM. L elbow with abrasion but nl ROM. L forearm and wrist nl ROM.   Neurological: He is alert and oriented to person, place,  and time.  Skin: Skin is warm and dry.  Psychiatric: He has a normal mood and affect. His behavior is normal. Judgment and thought content normal.    ED Course  Procedures (including critical care time)  Labs Reviewed - No data to display Dg Chest 2 View  01/20/2013  *RADIOLOGY REPORT*  Clinical Data: Larey Seat 1 day ago with left rib pain, shortness of breath  CHEST - 2 VIEW  Comparison: Chest x-ray of 10/13/2010  Findings: There is basilar linear atelectasis present left greater than right.  No pneumothorax is seen, but there does appear to be a small left effusion on the lateral view.  The left rib fractures noted on the left rib detail films are not visible by chest x-ray. The heart is mildly enlarged and stable.  Bilateral humeral head prostheses are present.  IMPRESSION:   Left basilar atelectasis and probable small left pleural effusion.   Original Report Authenticated  By: Dwyane Dee, M.D.    Dg Ribs Unilateral W/chest Left  01/20/2013  *RADIOLOGY REPORT*  Clinical Data: Larey Seat yesterday with left lateral rib pain, short of breath  LEFT RIBS AND CHEST - 3+ VIEW  Comparison: Chest x-ray of 10/13/2010  Findings: There are acute fractures of the anterior left fifth, sixth, and seventh ribs.  Mild left basilar atelectasis is noted. Left humeral head prosthesis is present.  IMPRESSION: Acute fractures of the left anterior fifth, sixth, and seventh ribs.   Original Report Authenticated By: Dwyane Dee, M.D.      No diagnosis found.    MDM  Tommy [name] is a 72 y.o. male here presenting with left-sided rib pain status post fall. We'll get x-rays to rule out rib fracture and will give pain meds and reassess.  6:31 PM Patient felt better after toradol. Xray showed L 5-7th rib fractures. Will d/c home on incentive spirometry, motrin, percocet. Return precautions given.         Richardean Canal, MD 01/20/13 [phone]

## 2013-12-31 ENCOUNTER — Encounter (HOSPITAL_BASED_OUTPATIENT_CLINIC_OR_DEPARTMENT_OTHER): Payer: Self-pay | Admitting: *Deleted

## 2013-12-31 NOTE — Progress Notes (Signed)
To come in for bmet-ekg-has had many surgeries-to bring cpap and will use post op-wife has cancer at home-friend to bring him

## 2014-01-01 ENCOUNTER — Encounter (HOSPITAL_BASED_OUTPATIENT_CLINIC_OR_DEPARTMENT_OTHER)
Admission: RE | Admit: 2014-01-01 | Discharge: 2014-01-01 | Disposition: A | Discharge status: Home / Self Care | Payer: Medicare Other | Source: Ambulatory Visit | Attending: Orthopedic Surgery | Admitting: Orthopedic Surgery

## 2014-01-01 ENCOUNTER — Other Ambulatory Visit: Payer: Self-pay

## 2014-01-01 DIAGNOSIS — Z01810 Encounter for preprocedural cardiovascular examination: Secondary | ICD-10-CM | POA: Insufficient documentation

## 2014-01-01 DIAGNOSIS — Z01812 Encounter for preprocedural laboratory examination: Secondary | ICD-10-CM | POA: Insufficient documentation

## 2014-01-01 DIAGNOSIS — Z01818 Encounter for other preprocedural examination: Secondary | ICD-10-CM | POA: Insufficient documentation

## 2014-01-01 LAB — BASIC METABOLIC PANEL
BUN: 17 mg/dL (ref 6–23)
CO2: 29 mEq/L (ref 19–32)
Calcium: 9.4 mg/dL (ref 8.4–10.5)
Chloride: 102 mEq/L (ref 96–112)
Creatinine, Ser: 1.21 mg/dL (ref 0.50–1.35)
GFR, EST AFRICAN AMERICAN: 68 mL/min — AB (ref >90)
GFR, EST NON AFRICAN AMERICAN: 59 mL/min — AB (ref >90)
Glucose, Bld: 105 mg/dL — ABNORMAL HIGH (ref 70–99)
POTASSIUM: 3.7 meq/L (ref 3.7–5.3)
SODIUM: 144 meq/L (ref 137–147)

## 2014-01-02 ENCOUNTER — Other Ambulatory Visit: Payer: Self-pay | Admitting: Orthopedic Surgery

## 2014-01-02 NOTE — H&P (Signed)
Tommy [name] is an 73 y.o. male.   Chief Complaint: Painful right great toe HPI: Pt presents to OR for surgical correction of bunion deformity of the right foot.  Pt denies N/V/F/C, chest pain, SOB, calf pain, or paresthesia bilaterally.  Past Medical History  Diagnosis Date  . Hypertension   . Sleep apnea     uses a cpap  . Hyperlipemia   . Seasonal allergies   . Arthritis     Past Surgical History  Procedure Laterality Date  . Joint replacement      bilateral knee replacemts  . Joint replacement      bilateral shoulder replacemts  . Knee arthroscopy      right and left  . Total knee arthroplasty  2005    right  . Total knee arthroplasty  2004    left  . Total shoulder arthroplasty  2012    left  . Shoulder arthroscopy  2011    right    History reviewed. No pertinent family history. Social History:  reports that he quit smoking about 30 years ago. He has never used smokeless tobacco. He reports that he does not drink alcohol or use illicit drugs.  Allergies: No Known Allergies  No prescriptions prior to admission    Results for orders placed during the hospital encounter of 01/03/14 (from the past 48 hour(s))  BASIC METABOLIC PANEL     Status: Abnormal   Collection Time    01/01/14  2:57 PM      Result Value Range   Sodium 144  137 - 147 mEq/L   Potassium 3.7  3.7 - 5.3 mEq/L   Chloride 102  96 - 112 mEq/L   CO2 29  19 - 32 mEq/L   Glucose, Bld 105 (*) 70 - 99 mg/dL   BUN 17  6 - 23 mg/dL   Creatinine, Ser 1.21  0.50 - 1.35 mg/dL   Calcium 9.4  8.4 - 10.5 mg/dL   GFR calc non Af Amer 59 (*) >90 mL/min   GFR calc Af Amer 68 (*) >90 mL/min   Comment: (NOTE)     The eGFR has been calculated using the CKD EPI equation.     This calculation has not been validated in all clinical situations.     eGFR's persistently <90 mL/min signify possible Chronic Kidney     Disease.   No results found.  Review of Systems  Constitutional: Negative.   HENT: Negative.    Eyes: Negative.   Respiratory: Negative.   Cardiovascular: Negative.   Gastrointestinal: Negative.   Musculoskeletal: Negative.   Skin: Negative.   Neurological: Negative for seizures.  Endo/Heme/Allergies: Negative.   Psychiatric/Behavioral: The patient is not nervous/anxious.     Height $Remov'5\' 6"'AxEMMu$  (1.676 m), weight 106.595 kg (235 lb). Physical Exam  WD WN 73 y/o male in NAD, A/Ox3, appears stated age.  EOMI, mood and affect normal, respirations unlabored.  Normal plantar grade feet with hallux valgus deformity of right foot.  +TTP to medial eminence with callus formation in this area as well.  Mild erythema but no evidence of infection noted.  DP pulses 2+ bilaterally.  Normal sensation to light touch intact.    Assessment/Plan Right hallux valgus - Pt reporting to OR for surgical correction with lapidus procedure.  The risks and benefits of the alternative treatment options have been discussed in detail.  The patient wishes to proceed with surgery and specifically understands risks of bleeding, infection, nerve damage, blood clots,  need for additional surgery, amputation and death.  FLOWERS, CHRISTOPHER S 01/02/2014, 11:06 AM  Agree with above note.

## 2014-01-03 ENCOUNTER — Encounter (HOSPITAL_BASED_OUTPATIENT_CLINIC_OR_DEPARTMENT_OTHER)
Admission: RE | Disposition: A | Discharge status: Home / Self Care | Payer: Self-pay | Source: Ambulatory Visit | Attending: Orthopedic Surgery

## 2014-01-03 ENCOUNTER — Ambulatory Visit (HOSPITAL_BASED_OUTPATIENT_CLINIC_OR_DEPARTMENT_OTHER)
Admission: RE | Admit: 2014-01-03 | Discharge: 2014-01-03 | Disposition: A | Discharge status: Home / Self Care | Payer: Medicare Other | Source: Ambulatory Visit | Attending: Orthopedic Surgery | Admitting: Orthopedic Surgery

## 2014-01-03 ENCOUNTER — Encounter (HOSPITAL_BASED_OUTPATIENT_CLINIC_OR_DEPARTMENT_OTHER): Payer: Medicare Other | Admitting: Anesthesiology

## 2014-01-03 ENCOUNTER — Encounter (HOSPITAL_BASED_OUTPATIENT_CLINIC_OR_DEPARTMENT_OTHER): Payer: Self-pay

## 2014-01-03 ENCOUNTER — Ambulatory Visit (HOSPITAL_BASED_OUTPATIENT_CLINIC_OR_DEPARTMENT_OTHER): Payer: Medicare Other | Admitting: Anesthesiology

## 2014-01-03 DIAGNOSIS — M2010 Hallux valgus (acquired), unspecified foot: Secondary | ICD-10-CM | POA: Insufficient documentation

## 2014-01-03 DIAGNOSIS — M2011 Hallux valgus (acquired), right foot: Secondary | ICD-10-CM

## 2014-01-03 DIAGNOSIS — Q66219 Congenital metatarsus primus varus, unspecified foot: Secondary | ICD-10-CM | POA: Insufficient documentation

## 2014-01-03 DIAGNOSIS — Z87891 Personal history of nicotine dependence: Secondary | ICD-10-CM | POA: Insufficient documentation

## 2014-01-03 DIAGNOSIS — G4730 Sleep apnea, unspecified: Secondary | ICD-10-CM | POA: Insufficient documentation

## 2014-01-03 DIAGNOSIS — I10 Essential (primary) hypertension: Secondary | ICD-10-CM | POA: Insufficient documentation

## 2014-01-03 DIAGNOSIS — E785 Hyperlipidemia, unspecified: Secondary | ICD-10-CM | POA: Insufficient documentation

## 2014-01-03 HISTORY — DX: Other seasonal allergic rhinitis: J30.2

## 2014-01-03 HISTORY — DX: Hyperlipidemia, unspecified: E78.5

## 2014-01-03 HISTORY — DX: Unspecified osteoarthritis, unspecified site: M19.90

## 2014-01-03 HISTORY — PX: BUNIONECTOMY: SHX129

## 2014-01-03 LAB — POCT HEMOGLOBIN-HEMACUE: HEMOGLOBIN: 15.8 g/dL (ref 13.0–17.0)

## 2014-01-03 SURGERY — BUNIONECTOMY
Anesthesia: General | Site: Foot | Laterality: Right

## 2014-01-03 MED ORDER — OXYCODONE HCL 5 MG PO TABS
ORAL_TABLET | ORAL | Status: AC
  Filled 2014-01-03: qty 1

## 2014-01-03 MED ORDER — EPHEDRINE SULFATE 50 MG/ML IJ SOLN
INTRAMUSCULAR | Status: DC | PRN
  Administered 2014-01-03 (×2): 10 mg via INTRAVENOUS

## 2014-01-03 MED ORDER — DEXAMETHASONE SODIUM PHOSPHATE 10 MG/ML IJ SOLN
INTRAMUSCULAR | Status: DC | PRN
  Administered 2014-01-03: 4 mg

## 2014-01-03 MED ORDER — FENTANYL CITRATE 0.05 MG/ML IJ SOLN
50.0000 ug | Freq: Once | INTRAMUSCULAR | Status: AC
  Administered 2014-01-03: 100 ug via INTRAVENOUS

## 2014-01-03 MED ORDER — MIDAZOLAM HCL 2 MG/2ML IJ SOLN: 1.0000 mg | INTRAMUSCULAR | Status: DC | PRN

## 2014-01-03 MED ORDER — MIDAZOLAM HCL 2 MG/2ML IJ SOLN
INTRAMUSCULAR | Status: AC
  Filled 2014-01-03: qty 2

## 2014-01-03 MED ORDER — CEFAZOLIN SODIUM-DEXTROSE 2-3 GM-% IV SOLR
2.0000 g | INTRAVENOUS | Status: AC
  Administered 2014-01-03: 2 g via INTRAVENOUS

## 2014-01-03 MED ORDER — BACITRACIN ZINC 500 UNIT/GM EX OINT
TOPICAL_OINTMENT | CUTANEOUS | Status: DC | PRN
  Administered 2014-01-03: 1 via TOPICAL

## 2014-01-03 MED ORDER — FENTANYL CITRATE 0.05 MG/ML IJ SOLN
INTRAMUSCULAR | Status: DC | PRN
Start: 2014-01-03 — End: 2014-01-03
  Administered 2014-01-03 (×2): 25 ug via INTRAVENOUS

## 2014-01-03 MED ORDER — LIDOCAINE HCL (CARDIAC) 20 MG/ML IV SOLN
INTRAVENOUS | Status: DC | PRN
  Administered 2014-01-03: 60 mg via INTRAVENOUS

## 2014-01-03 MED ORDER — OXYCODONE HCL 5 MG/5ML PO SOLN: 5.0000 mg | Freq: Once | ORAL | Status: AC | PRN

## 2014-01-03 MED ORDER — ACETAMINOPHEN 500 MG PO TABS
1000.0000 mg | ORAL_TABLET | Freq: Once | ORAL | Status: AC
  Administered 2014-01-03: 500 mg via ORAL

## 2014-01-03 MED ORDER — ACETAMINOPHEN 500 MG PO TABS
ORAL_TABLET | ORAL | Status: AC
  Filled 2014-01-03: qty 2

## 2014-01-03 MED ORDER — BUPIVACAINE-EPINEPHRINE PF 0.5-1:200000 % IJ SOLN
INTRAMUSCULAR | Status: AC
  Filled 2014-01-03: qty 30

## 2014-01-03 MED ORDER — ONDANSETRON HCL 4 MG/2ML IJ SOLN
INTRAMUSCULAR | Status: DC | PRN
  Administered 2014-01-03: 4 mg via INTRAVENOUS

## 2014-01-03 MED ORDER — FENTANYL CITRATE 0.05 MG/ML IJ SOLN
INTRAMUSCULAR | Status: AC
  Filled 2014-01-03: qty 2

## 2014-01-03 MED ORDER — DEXAMETHASONE SODIUM PHOSPHATE 10 MG/ML IJ SOLN
INTRAMUSCULAR | Status: DC | PRN
  Administered 2014-01-03: 10 mg via INTRAVENOUS

## 2014-01-03 MED ORDER — HYDROMORPHONE HCL PF 1 MG/ML IJ SOLN: 0.2500 mg | INTRAMUSCULAR | Status: DC | PRN

## 2014-01-03 MED ORDER — LACTATED RINGERS IV SOLN
INTRAVENOUS | Status: DC
  Administered 2014-01-03: 07:00:00 via INTRAVENOUS

## 2014-01-03 MED ORDER — OXYCODONE HCL 5 MG PO TABS
5.0000 mg | ORAL_TABLET | Freq: Once | ORAL | Status: AC | PRN
  Administered 2014-01-03: 5 mg via ORAL

## 2014-01-03 MED ORDER — CHLORHEXIDINE GLUCONATE 4 % EX LIQD: 60.0000 mL | Freq: Once | CUTANEOUS | Status: DC

## 2014-01-03 MED ORDER — BACITRACIN ZINC 500 UNIT/GM EX OINT
TOPICAL_OINTMENT | CUTANEOUS | Status: AC
  Filled 2014-01-03: qty 28.35

## 2014-01-03 MED ORDER — FENTANYL CITRATE 0.05 MG/ML IJ SOLN
INTRAMUSCULAR | Status: AC
  Filled 2014-01-03: qty 6

## 2014-01-03 MED ORDER — CEFAZOLIN SODIUM-DEXTROSE 2-3 GM-% IV SOLR
INTRAVENOUS | Status: AC
  Filled 2014-01-03: qty 50

## 2014-01-03 MED ORDER — BUPIVACAINE-EPINEPHRINE PF 0.5-1:200000 % IJ SOLN
INTRAMUSCULAR | Status: DC | PRN
  Administered 2014-01-03: 50 mL

## 2014-01-03 MED ORDER — 0.9 % SODIUM CHLORIDE (POUR BTL) OPTIME
TOPICAL | Status: DC | PRN
  Administered 2014-01-03: 300 mL

## 2014-01-03 MED ORDER — MIDAZOLAM HCL 2 MG/2ML IJ SOLN
1.0000 mg | INTRAMUSCULAR | Status: DC | PRN
  Administered 2014-01-03: 2 mg via INTRAVENOUS

## 2014-01-03 MED ORDER — FENTANYL CITRATE 0.05 MG/ML IJ SOLN: 50.0000 ug | INTRAMUSCULAR | Status: DC | PRN

## 2014-01-03 MED ORDER — PROPOFOL 10 MG/ML IV BOLUS
INTRAVENOUS | Status: DC | PRN
  Administered 2014-01-03: 170 mg via INTRAVENOUS

## 2014-01-03 MED ORDER — PROPOFOL 10 MG/ML IV EMUL
INTRAVENOUS | Status: AC
  Filled 2014-01-03: qty 100

## 2014-01-03 MED ORDER — OXYCODONE HCL 5 MG PO TABS: 5.0000 mg | ORAL_TABLET | ORAL | Status: DC | PRN

## 2014-01-03 SURGICAL SUPPLY — 90 items
BANDAGE ESMARK 6X9 LF (GAUZE/BANDAGES/DRESSINGS) ×1 IMPLANT
BIT DRILL 2.5X2.75 QC CALB (BIT) ×1 IMPLANT
BIT DRILL 2.9 CANN QC NONSTRL (BIT) ×1 IMPLANT
BLADE AVERAGE 25X9 (BLADE) IMPLANT
BLADE MICRO SAGITTAL (BLADE) ×1 IMPLANT
BLADE MINI RND TIP GREEN BEAV (BLADE) ×2 IMPLANT
BLADE OSC/SAG .038X5.5 CUT EDG (BLADE) IMPLANT
BLADE SURG 15 STRL LF DISP TIS (BLADE) ×3 IMPLANT
BLADE SURG 15 STRL SS (BLADE) ×6
BNDG CMPR 9X4 STRL LF SNTH (GAUZE/BANDAGES/DRESSINGS)
BNDG CMPR 9X6 STRL LF SNTH (GAUZE/BANDAGES/DRESSINGS) ×1
BNDG COHESIVE 4X5 TAN STRL (GAUZE/BANDAGES/DRESSINGS) ×2 IMPLANT
BNDG COHESIVE 6X5 TAN STRL LF (GAUZE/BANDAGES/DRESSINGS) IMPLANT
BNDG CONFORM 2 STRL LF (GAUZE/BANDAGES/DRESSINGS) IMPLANT
BNDG CONFORM 3 STRL LF (GAUZE/BANDAGES/DRESSINGS) ×2 IMPLANT
BNDG ESMARK 4X9 LF (GAUZE/BANDAGES/DRESSINGS) IMPLANT
BNDG ESMARK 6X9 LF (GAUZE/BANDAGES/DRESSINGS) ×2
CHLORAPREP W/TINT 26ML (MISCELLANEOUS) ×2 IMPLANT
COVER TABLE BACK 60X90 (DRAPES) ×2 IMPLANT
CUFF TOURNIQUET SINGLE 18IN (TOURNIQUET CUFF) IMPLANT
CUFF TOURNIQUET SINGLE 34IN LL (TOURNIQUET CUFF) ×2 IMPLANT
DRAPE EXTREMITY T 121X128X90 (DRAPE) ×2 IMPLANT
DRAPE OEC MINIVIEW 54X84 (DRAPES) ×2 IMPLANT
DRAPE SURG 17X23 STRL (DRAPES) IMPLANT
DRAPE U-SHAPE 47X51 STRL (DRAPES) ×2 IMPLANT
DRSG EMULSION OIL 3X3 NADH (GAUZE/BANDAGES/DRESSINGS) ×2 IMPLANT
ELECT REM PT RETURN 9FT ADLT (ELECTROSURGICAL) ×2
ELECTRODE REM PT RTRN 9FT ADLT (ELECTROSURGICAL) ×1 IMPLANT
GAUZE SPONGE 4X4 16PLY XRAY LF (GAUZE/BANDAGES/DRESSINGS) IMPLANT
GLOVE BIO SURGEON STRL SZ8 (GLOVE) ×2 IMPLANT
GLOVE BIOGEL PI IND STRL 7.0 (GLOVE) IMPLANT
GLOVE BIOGEL PI IND STRL 7.5 (GLOVE) ×1 IMPLANT
GLOVE BIOGEL PI IND STRL 8 (GLOVE) ×1 IMPLANT
GLOVE BIOGEL PI INDICATOR 7.0 (GLOVE) ×1
GLOVE BIOGEL PI INDICATOR 7.5 (GLOVE)
GLOVE BIOGEL PI INDICATOR 8 (GLOVE) ×1
GLOVE ECLIPSE 6.5 STRL STRAW (GLOVE) ×1 IMPLANT
GLOVE ECLIPSE 7.0 STRL STRAW (GLOVE) ×1 IMPLANT
GLOVE EXAM NITRILE MD LF STRL (GLOVE) ×1 IMPLANT
GOWN STRL REUS W/ TWL LRG LVL3 (GOWN DISPOSABLE) ×2 IMPLANT
GOWN STRL REUS W/ TWL XL LVL3 (GOWN DISPOSABLE) ×1 IMPLANT
GOWN STRL REUS W/TWL LRG LVL3 (GOWN DISPOSABLE) ×2
GOWN STRL REUS W/TWL XL LVL3 (GOWN DISPOSABLE) ×2
K-WIRE .045X4 (WIRE) IMPLANT
K-WIRE 102X1.4 (WIRE) IMPLANT
K-WIRE ACE 1.6X6 (WIRE) ×4
K-WIRE DBL END .054 LG (WIRE) ×1 IMPLANT
KWIRE ACE 1.6X6 (WIRE) IMPLANT
NDL HYPO 25X1 1.5 SAFETY (NEEDLE) IMPLANT
NEEDLE HYPO 22GX1.5 SAFETY (NEEDLE) IMPLANT
NEEDLE HYPO 25X1 1.5 SAFETY (NEEDLE) IMPLANT
NS IRRIG 1000ML POUR BTL (IV SOLUTION) ×2 IMPLANT
PACK BASIN DAY SURGERY FS (CUSTOM PROCEDURE TRAY) ×2 IMPLANT
PAD ABD 8X10 STRL (GAUZE/BANDAGES/DRESSINGS) ×2 IMPLANT
PAD CAST 4YDX4 CTTN HI CHSV (CAST SUPPLIES) ×1 IMPLANT
PADDING CAST ABS 4INX4YD NS (CAST SUPPLIES) ×1
PADDING CAST ABS COTTON 4X4 ST (CAST SUPPLIES) ×1 IMPLANT
PADDING CAST COTTON 4X4 STRL (CAST SUPPLIES) ×2
PADDING CAST COTTON 6X4 STRL (CAST SUPPLIES) ×1 IMPLANT
PENCIL BUTTON HOLSTER BLD 10FT (ELECTRODE) ×1 IMPLANT
SANITIZER HAND PURELL 535ML FO (MISCELLANEOUS) ×2 IMPLANT
SCREW ACE CAN 4.0 42M (Screw) ×1 IMPLANT
SCREW ACE CAN 4.0 46M (Screw) ×1 IMPLANT
SCREW LOW PROFILE 3.5X48MM (Screw) ×1 IMPLANT
SHEET MEDIUM DRAPE 40X70 STRL (DRAPES) ×2 IMPLANT
SLEEVE SCD COMPRESS KNEE MED (MISCELLANEOUS) ×2 IMPLANT
SPLINT FAST PLASTER 5X30 (CAST SUPPLIES) ×20
SPLINT PLASTER CAST FAST 5X30 (CAST SUPPLIES) IMPLANT
SPONGE GAUZE 4X4 12PLY (GAUZE/BANDAGES/DRESSINGS) ×2 IMPLANT
SPONGE LAP 18X18 X RAY DECT (DISPOSABLE) ×2 IMPLANT
STOCKINETTE 6  STRL (DRAPES) ×1
STOCKINETTE 6 STRL (DRAPES) ×1 IMPLANT
SUCTION FRAZIER TIP 10 FR DISP (SUCTIONS) ×1 IMPLANT
SUT ETHILON 3 0 FSL (SUTURE) IMPLANT
SUT ETHILON 3 0 PS 1 (SUTURE) ×3 IMPLANT
SUT ETHILON 4 0 PS 2 18 (SUTURE) IMPLANT
SUT MNCRL AB 3-0 PS2 18 (SUTURE) ×2 IMPLANT
SUT PROLENE 3 0 PS 1 (SUTURE) ×1 IMPLANT
SUT VIC AB 0 SH 27 (SUTURE) ×1 IMPLANT
SUT VIC AB 2-0 PS2 27 (SUTURE) IMPLANT
SUT VIC AB 3-0 PS1 18 (SUTURE)
SUT VIC AB 3-0 PS1 18XBRD (SUTURE) IMPLANT
SUT VICRYL 4-0 PS2 18IN ABS (SUTURE) IMPLANT
SYR BULB 3OZ (MISCELLANEOUS) ×2 IMPLANT
SYR CONTROL 10ML LL (SYRINGE) IMPLANT
TOWEL OR 17X24 6PK STRL BLUE (TOWEL DISPOSABLE) ×3 IMPLANT
TOWEL OR NON WOVEN STRL DISP B (DISPOSABLE) ×2 IMPLANT
TUBE CONNECTING 20X1/4 (TUBING) IMPLANT
UNDERPAD 30X30 INCONTINENT (UNDERPADS AND DIAPERS) ×2 IMPLANT
YANKAUER SUCT BULB TIP NO VENT (SUCTIONS) IMPLANT

## 2014-01-03 NOTE — Anesthesia Procedure Notes (Addendum)
Anesthesia Regional Block:  Popliteal block  Pre-Anesthetic Checklist: ,, timeout performed, Correct Patient, Correct Site, Correct Laterality, Correct Procedure, Correct Position, site marked, Risks and benefits discussed,  Surgical consent,  Pre-op evaluation,  At surgeon's request and post-op pain management  Laterality: Right  Prep: chloraprep       Needles:  Injection technique: Single-shot  Needle Type: Echogenic Stimulator Needle      Needle Gauge: 22 and 22 G    Additional Needles:  Procedures: nerve stimulator Popliteal block  Nerve Stimulator or Paresthesia:  Response: 0.5 mA,   Additional Responses:   Narrative:  Start time: 01/03/2014 7:05 AM End time: 01/03/2014 7:12 AM Injection made incrementally with aspirations every 5 mL. Anesthesiologist: Dr Chriss Driver  Additional Notes: [id number] R Popliteal Nerve Block POP CHG prep, sterile tech #22 stim needle with stim down to .36ma Multiple neg asp Altamese Dilling .5% w/epi 1:200000 total 50cc+decadron 4mg  infiltrated No compl Dr Chriss Driver   Procedure Name: LMA Insertion Date/Time: 01/03/2014 7:39 AM Performed by: Ayani Ospina Pre-anesthesia Checklist: Patient identified, Emergency Drugs available, Suction available and Patient being monitored Patient Re-evaluated:Patient Re-evaluated prior to inductionOxygen Delivery Method: Circle System Utilized Preoxygenation: Pre-oxygenation with 100% oxygen Intubation Type: IV induction Ventilation: Mask ventilation without difficulty LMA: LMA inserted LMA Size: 4.0 Number of attempts: 1 Airway Equipment and Method: bite block Placement Confirmation: positive ETCO2 Tube secured with: Tape Dental Injury: Teeth and Oropharynx as per pre-operative assessment

## 2014-01-03 NOTE — Progress Notes (Signed)
Assisted Dr. Kasik with right, popliteal/saphenous block. Side rails up, monitors on throughout procedure. See vital signs in flow sheet. Tolerated Procedure well. 

## 2014-01-03 NOTE — Transfer of Care (Signed)
Immediate Anesthesia Transfer of Care Note  Patient: Tommy [name]  Procedure(s) Performed: Procedure(s): RIGHT LAPIDUS BUNION CORRECTION (Right)  Patient Location: PACU  Anesthesia Type:GA combined with regional for post-op pain  Level of Consciousness: awake and patient cooperative  Airway & Oxygen Therapy: Patient Spontanous Breathing and Patient connected to face mask oxygen  Post-op Assessment: Report given to PACU RN and Post -op Vital signs reviewed and stable  Post vital signs: Reviewed and stable  Complications: No apparent anesthesia complications

## 2014-01-03 NOTE — Brief Op Note (Signed)
01/03/2014  9:19 AM  PATIENT:  Tommy [name]  73 y.o. male  PRE-OPERATIVE DIAGNOSIS:  Right Hallux Valgas and Metatarsaus Primus Varus  POST-OPERATIVE DIAGNOSIS:  same  Procedure(s): 1.  RIGHT LAPIDUS BUNION CORRECTION 2.  Right foot AP and lateral radiographs  SURGEON:  Wylene Simmer, MD  ASSISTANT: n/a  ANESTHESIA:   General, regional  EBL:  minimal   TOURNIQUET:   Total Tourniquet Time Documented: Thigh (Right) - 69 minutes Total: Thigh (Right) - 69 minutes   COMPLICATIONS:  None apparent  DISPOSITION:  Extubated, awake and stable to recovery.  DICTATION ID:  [id number]

## 2014-01-03 NOTE — Discharge Instructions (Signed)
John Hewitt, MD °[facility] ° °Please read the following information regarding your care after surgery. ° °Medications  °You only need a prescription for the narcotic pain medicine (ex. oxycodone, Percocet, Norco).  All of the other medicines listed below are available over the counter. °X acetominophen (Tylenol) 650 mg every 4-6 hours as you need for minor pain °X oxycodone as prescribed for moderate to severe pain ° °Narcotic pain medicine (ex. oxycodone, Percocet, Vicodin) will cause constipation.  To prevent this problem, take the following medicines while you are taking any pain medicine. °X docusate sodium (Colace) 100 mg twice a day X senna (Senokot) 2 tablets twice a day ° °X To help prevent blood clots, take an aspirin (325 mg) once a day for a month after surgery.  You should also get up every hour while you are awake to move around.   ° °Weight Bearing °? Bear weight when you are able on your operated leg or foot. °? Bear weight only on the heel of your operated foot in the post-op shoe. °X Do not bear any weight on the operated leg or foot. ° °Cast / Splint / Dressing °X Keep your splint or cast clean and dry.  Don’t put anything (coat hanger, pencil, etc) down inside of it.  If it gets damp, use a hair dryer on the cool setting to dry it.  If it gets soaked, call the office to schedule an appointment for a cast change. °? Remove your dressing 3 days after surgery and cover the incisions with dry dressings.   ° °After your dressing, cast or splint is removed; you may shower, but do not soak or scrub the wound.  Allow the water to run over it, and then gently pat it dry. ° °Swelling °It is normal for you to have swelling where you had surgery.  To reduce swelling and pain, keep your toes above your nose for at least 3 days after surgery.  It may be necessary to keep your foot or leg elevated for several weeks.  If it hurts, it should be elevated. ° °Follow Up °Call my office at [phone]  when you are discharged from the hospital or surgery center to schedule an appointment to be seen two weeks after surgery. ° °Call my office at [phone] if you develop a fever >101.5° F, nausea, vomiting, bleeding from the surgical site or severe pain.   ° ° °Post Anesthesia Home Care Instructions ° °Activity: °Get plenty of rest for the remainder of the day. A responsible adult should stay with you for 24 hours following the procedure.  °For the next 24 hours, DO NOT: °-Drive a car °-Operate machinery °-Drink alcoholic beverages °-Take any medication unless instructed by your physician °-Make any legal decisions or sign important papers. ° °Meals: °Start with liquid foods such as gelatin or soup. Progress to regular foods as tolerated. Avoid greasy, spicy, heavy foods. If nausea and/or vomiting occur, drink only clear liquids until the nausea and/or vomiting subsides. Call your physician if vomiting continues. ° °Special Instructions/Symptoms: °Your throat may feel dry or sore from the anesthesia or the breathing tube placed in your throat during surgery. If this causes discomfort, gargle with warm salt water. The discomfort should disappear within 24 hours. ° ° ° °Regional Anesthesia Blocks ° °1. Numbness or the inability to move the "blocked" extremity may last from 3-48 hours after placement. The length of time depends on the medication injected and your individual response to the medication.   If the numbness is not going away after 48 hours, call your surgeon. ° °2. The extremity that is blocked will need to be protected until the numbness is gone and the  Strength has returned. Because you cannot feel it, you will need to take extra care to avoid injury. Because it may be weak, you may have difficulty moving it or using it. You may not know what position it is in without looking at it while the block is in effect. ° °3. For blocks in the legs and feet, returning to weight bearing and walking needs to be  done carefully. You will need to wait until the numbness is entirely gone and the strength has returned. You should be able to move your leg and foot normally before you try and bear weight or walk. You will need someone to be with you when you first try to ensure you do not fall and possibly risk injury. ° °4. Bruising and tenderness at the needle site are common side effects and will resolve in a few days. ° °5. Persistent numbness or new problems with movement should be communicated to the surgeon or the  Surgery Center ([phone])/ [facility] ([phone]). °

## 2014-01-03 NOTE — Anesthesia Postprocedure Evaluation (Signed)
  Anesthesia Post-op Note  Patient: Tommy [name]  Procedure(s) Performed: Procedure(s): RIGHT LAPIDUS BUNION CORRECTION (Right)  Patient Location: PACU  Anesthesia Type:GA combined with regional for post-op pain  Level of Consciousness: awake and alert   Airway and Oxygen Therapy: Patient Spontanous Breathing  Post-op Pain: none  Post-op Assessment: Post-op Vital signs reviewed, Patient's Cardiovascular Status Stable, Respiratory Function Stable, Patent Airway, No signs of Nausea or vomiting and Pain level controlled  Post-op Vital Signs: Reviewed and stable  Complications: No apparent anesthesia complications

## 2014-01-03 NOTE — Anesthesia Preprocedure Evaluation (Signed)
Anesthesia Evaluation  Patient identified by MRN, date of birth, ID band Patient awake    Reviewed: Allergy & Precautions, H&P , NPO status , Patient's Chart, lab work & pertinent test results  Airway Mallampati: II TM Distance: <3 FB Neck ROM: Limited    Dental   Pulmonary sleep apnea , former smoker,  breath sounds clear to auscultation        Cardiovascular hypertension, Rhythm:Regular Rate:Normal     Neuro/Psych    GI/Hepatic   Endo/Other  Morbid obesity  Renal/GU      Musculoskeletal   Abdominal (+) + obese,   Peds  Hematology   Anesthesia Other Findings   Reproductive/Obstetrics                           Anesthesia Physical Anesthesia Plan  ASA: III  Anesthesia Plan: General   Post-op Pain Management:    Induction: Intravenous  Airway Management Planned: LMA  Additional Equipment:   Intra-op Plan:   Post-operative Plan: Extubation in OR  Informed Consent: I have reviewed the patients History and Physical, chart, labs and discussed the procedure including the risks, benefits and alternatives for the proposed anesthesia with the patient or authorized representative who has indicated his/her understanding and acceptance.     Plan Discussed with: CRNA and Surgeon  Anesthesia Plan Comments:         Anesthesia Quick Evaluation

## 2014-01-04 ENCOUNTER — Encounter (HOSPITAL_BASED_OUTPATIENT_CLINIC_OR_DEPARTMENT_OTHER): Payer: Self-pay | Admitting: Orthopedic Surgery

## 2014-01-04 NOTE — Op Note (Signed)
NAME:  Tommy [name], Tommy [name] NO.:  [id number]  MEDICAL RECORD NO.:  [id number]  LOCATION:                                 FACILITY:  PHYSICIAN:  Wylene Simmer, MD             DATE OF BIRTH:  DATE OF PROCEDURE:  01/03/2014 DATE OF DISCHARGE:                              OPERATIVE REPORT   PREOPERATIVE DIAGNOSES: 1. Right hallux valgus. 2. Right metatarsus primus varus.  POSTOPERATIVE DIAGNOSES: 1. Right hallux valgus. 2. Right metatarsus primus varus.  PROCEDURE: 1. Right foot Lapidus bunion correction with distal soft tissue     procedure. 2. Right foot AP and lateral radiographs.  SURGEON:  Wylene Simmer, MD  ANESTHESIA:  General, regional.  ESTIMATED BLOOD LOSS:  Minimal.  TOURNIQUET TIME:  69 minutes 220 mmHg.  COMPLICATIONS:  None apparent.  DISPOSITION:  Extubated, awake, and stable to recovery.  INDICATIONS FOR PROCEDURE:  The patient is a 73 year old male who has a painful right foot bunion deformity.  He has failed nonoperative treatment to date including activity modification, shoe wear modification, and oral anti-inflammatories.  He presents now for operative treatment of this condition.  He understands the risks and benefits, the alternative treatment options and elects surgical treatment.  He specifically understands the risks of bleeding, infection, nerve damage, blood clots, need for additional surgery, amputation, and death.  PROCEDURE IN DETAIL:  After preoperative consent was obtained and the correct operative site was identified, the patient was brought to the operating room and placed supine on the operating room table.  General anesthesia was induced.  Preoperative antibiotics were administered. Surgical time-out was taken.  The right lower extremity was prepped and draped in standard sterile fashion with a tourniquet around the thigh. The extremity was exsanguinated.  The tourniquet was inflated to 250 mmHg.  A longitudinal  incision was then made at the dorsum of the first web space.  Sharp dissection was carried down through the skin and subcutaneous tissue.  The intermetatarsal ligament was divided under direct vision.  An arthrotomy was then made between the sesamoid and the metatarsal head.  Several small perforations were made in the lateral joint capsule.  The hallux MP joint could then be positioned in 30 degrees of varus passively.  Attention was then turned to medial eminence where longitudinal incision was made.  Sharp dissection was carried down through the skin and subcutaneous tissue.  The joint capsule was incised and elevated plantarly and dorsally.  The medial eminence was resected in line with the metatarsal shaft.  The cut surfaces were smoothed with a rongeur.  Attention was then turned to the dorsum of the foot where a longitudinal incision was made over the first TMT joint.  Sharp dissection was carried down through the skin and subcutaneous tissue.  The interval between the extensor hallucis longus and extensor hallucis brevis was developed.  The joint capsule was incised.  There was extensive arthritic change within the first tarsometatarsal joint.  An oscillating saw was then used to resect the articular cartilage and subchondral bone of the distal medial cuneiform.  Saw was then angled to resect the base of the first  metatarsal taking more bone laterally to correct the intermetatarsal angle.  The cut bone was all removed.  The wound was irrigated copiously.  The joint was prepared by perforating the subchondral bone with a 2.5-mm drill bit leaving the bone graft into place.  The joint was reduced and provisionally pinned.  AP and lateral radiographs confirmed appropriate correction of the intermetatarsal angle.  The joint was then fixed with a 4-mm partially-threaded lag screw compressing the joint appropriately.  A second screw was then inserted from distal to proximal.  This was  a 3.5-mm fully-threaded cortical screw with a low-profile head.  Both of these were Biomet screws.  The final AP and lateral radiographs confirmed appropriate position and length of all hardware and appropriate correction of the intermetatarsal hallux valgus ankles.  Wounds were irrigated copiously. The medial joint capsule was repaired with imbricating sutures of 0 Vicryl.  The dorsal extensor tendon sheath was then closed with a running 0 Vicryl.  Subcutaneous tissues were approximated with inverted simple sutures of 3-0 Monocryl.  The incisions were all closed with running 3-0 nylon sutures.  Sterile dressings were applied followed by a well-padded short-leg splint.  Tourniquet was released at 1 hour 9 minutes.  The patient was then awakened from anesthesia and transported to the recovery room in stable condition.  FOLLOWUP PLAN:  The patient will be nonweightbearing on his right lower extremity in a splint.  He will follow up with me in 2 weeks for suture removal and conversion to a cast.  RADIOGRAPHS:  AP and lateral radiographs of the right foot were obtained intra-operatively.  These show interval correction of the right foot bunion deformity with screws traversing the 1st TMT joint.  No acute injury is noted.   Wylene Simmer, MD     JH/MEDQ  D:  01/03/2014  T:  01/04/2014  Job:  [id number]

## 2014-05-14 ENCOUNTER — Institutional Professional Consult (permissible substitution): Payer: Self-pay | Admitting: Neurology

## 2014-05-16 ENCOUNTER — Encounter (INDEPENDENT_AMBULATORY_CARE_PROVIDER_SITE_OTHER): Payer: Self-pay

## 2014-05-16 ENCOUNTER — Ambulatory Visit (INDEPENDENT_AMBULATORY_CARE_PROVIDER_SITE_OTHER): Payer: Medicare Other | Admitting: Neurology

## 2014-05-16 ENCOUNTER — Encounter: Payer: Self-pay | Admitting: Neurology

## 2014-05-16 VITALS — BP 126/81 | HR 70 | Resp 18 | Ht 65.0 in | Wt 256.0 lb

## 2014-05-16 DIAGNOSIS — G4733 Obstructive sleep apnea (adult) (pediatric): Secondary | ICD-10-CM | POA: Insufficient documentation

## 2014-05-16 DIAGNOSIS — E669 Obesity, unspecified: Secondary | ICD-10-CM

## 2014-05-16 DIAGNOSIS — Z9989 Dependence on other enabling machines and devices: Principal | ICD-10-CM

## 2014-05-16 DIAGNOSIS — I10 Essential (primary) hypertension: Secondary | ICD-10-CM | POA: Insufficient documentation

## 2014-05-16 NOTE — Progress Notes (Signed)
[facility]  Provider:  Larey [name], [place] D  Referring Provider: Tivis Ringer, MD Primary Care Physician:  Tommy Ringer, MD  Chief Complaint  Patient presents with  . New Evaluation    Room 10  . Sleep consult    HPI:  Tommy [name] is a married , caucasian  73 y.o. male , who  is seen here as a referral  from Dr. Dagmar [name] for a sleep apnea evaluation.   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 He was evaluated for sleep apnea at the age of 67 in December 2006. His sleep study at the [facility] documented an AHI of 58 the patient did not benefit from CPAP he used at home and returned for  titration to  Adapt- VPAP machine in January 2007.   VPAP machine was prescribed at the time with an EEP of 9 cm water and a pressure support setting between 3 and 10 cm water.  The patient states that he felt for many years well treated under these pressure settings. However his machine now is not longer functioning well he does note a supply him with the air he crates, it'll spontaneously switch off in the middle of nocturnal use. The download is not obtainable from the machine this age. The machine seems not longer to hold pressure.  He was followed by a [employer] medical [employer] company sleep meds but then as he became an Medicare patient please contact change to [employer]. The patient was then referred for a new sleep apnea evaluation to document the current degree of apnea and if present to be titrate as of now on needed pressures.  His current sleep habits are as follows- he goes to bed at 11PM but needs about 20-30 minutes to initiate sleep , waking up frequently , unsure why he wakes . He sleeps for the longest uninterrupted time about 2 hours. He has 3 nocturia breaks on average. He will assist his wife with some water etc.  He sleeps only 4-5 hours at the most. He wakes up spontaneously , needs only 15  minutes to rise and get ready. No Alarm used.  He drinks decaffeinated tea and coffee, cola with caffeine 3-4 a week.  His morning is characterized by a dry mouth, but no headaches.    He craves 6-7 hours of sleep as he used to have when his machine worked well.  He went to the gym 4 times weekly, since retirement in 03-2013 , but his wife illness now makes this impossible. He naps now in daytime, power-naps in the afternoon,of 30 minutes or less, feeling refreshed after wards.   He has had no sinus surgery, no tonsillectomy and no septal correction.   His son has sleep apnea and used CPAP.      Review of Systems: Out of a complete 14 system review, the patient complains of only the following symptoms, and all other reviewed systems are negative. Tommy [name] endorsed  the depression score at 0 points, the Epworth sleepiness scale at 13 points and the fatigue severity scale at 18 points.  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  He stated that he snores, is daytime sleepy and has tinnitus.  Weight gain over 25 pounds in the last 10-12 month.     History   Social History  . Marital Status: Married    Spouse Name: Vaughan Basta  Number of Children: 4  . Years of Education: 13   Occupational History  . Not on file.   Social History Main Topics  . Smoking status: Former Smoker    Quit date: 01/20/1983  . Smokeless tobacco: Never Used  . Alcohol Use: No  . Drug Use: No  . Sexual Activity: Not on file   Other Topics Concern  . Not on file   Social History Narrative   Patient is married Vaughan Basta) and lives at home with his wife.   Patient has four adult children.   Patient is retired.   Patient has a college education.   Patient is right-handed.   Patient drinks three-four sodas per week.    Family History  Problem Relation Age of Onset  . Glaucoma Father   . Alzheimer's disease Mother   . Dementia Mother   . Heart Problems Maternal Grandfather    . Heart Problems Maternal Grandmother     Past Medical History  Diagnosis Date  . Hypertension   . Sleep apnea     uses a cpap  . Hyperlipemia   . Seasonal allergies   . Arthritis   . OSA on CPAP     Past Surgical History  Procedure Laterality Date  . Joint replacement      bilateral knee replacemts  . Joint replacement      bilateral shoulder replacemts  . Knee arthroscopy      right and left  . Total knee arthroplasty  2005    right  . Total knee arthroplasty  2004    left  . Total shoulder arthroplasty  2012    left  . Shoulder arthroscopy  2011    right  . Bunionectomy Right 01/03/2014    Procedure: RIGHT LAPIDUS BUNION CORRECTION;  Surgeon: Wylene Simmer, MD;  Location: [facility];  Service: Orthopedics;  Laterality: Right;    Current Outpatient Prescriptions  Medication Sig Dispense Refill  . amLODipine (NORVASC) 10 MG tablet Take 10 mg by mouth every morning.      Marland Kitchen aspirin 325 MG EC tablet Take 325 mg by mouth daily.      Marland Kitchen atorvastatin (LIPITOR) 20 MG tablet Take 20 mg by mouth every evening.      . benazepril (LOTENSIN) 40 MG tablet Take 40 mg by mouth every morning.      . fluticasone (FLONASE) 50 MCG/ACT nasal spray Place 2 sprays into the nose daily.      Marland Kitchen ofloxacin (FLOXIN) 0.3 % otic solution As needed      . oxyCODONE (ROXICODONE) 5 MG immediate release tablet Take 1-2 tablets (5-10 mg total) by mouth every 4 (four) hours as needed.  30 tablet  0  . timolol (TIMOPTIC) 0.5 % ophthalmic solution 1 drop 2 (two) times daily. Left eye      . venlafaxine XR (EFFEXOR-XR) 75 MG 24 hr capsule Take 225 mg by mouth every morning.       No current facility-administered medications for this visit.    Allergies as of 05/16/2014  . (No Known Allergies)    Vitals: BP 126/81  Pulse 70  Resp 18  Ht 5\' 5"  (1.651 m)  Wt 256 lb (116.121 kg)  BMI 42.60 kg/m2 Last Weight:  Wt Readings from Last 1 Encounters:  05/16/14 256 lb (116.121 kg)   Last  Height:   Ht Readings from Last 1 Encounters:  05/16/14 5\' 5"  (1.651 m)   BMI  .  Physical exam:  General: The patient is awake, alert and appears not in acute distress. The patient is well groomed. Head: Normocephalic, atraumatic. Neck is supple. Mallampati 4-5 invisible uvula  , neck circumference:20  inches, no TMJ , no dentures, no retrognathia.  Short, thick neck .  Cardiovascular:  Regular rate and rhythm , without  murmurs or carotid bruit, and without distended neck veins. Respiratory: Lungs are clear to auscultation. [place] chested. Skin:  Without evidence of edema, or rash Trunk: BMI is  elevated and patient  has normal posture.  Neurologic exam : The patient is awake and alert, oriented to place and time.  Memory subjective   described as intact. There is a normal attention span & concentration ability.  Speech is fluent without   dysarthria, dysphonia or aphasia. Mood and affect are appropriate.  Cranial nerves: Pupils are equal and briskly reactive to light. Funduscopic exam without  evidence of pallor or edema.  Extraocular movements  in vertical and horizontal planes intact and without nystagmus. Visual fields by finger perimetry are intact. Hearing to finger rub intact.  Facial sensation intact to fine touch. Facial motor strength is symmetric and tongue and uvula move midline.  Motor exam:  Normal tone and normal muscle bulk and symmetric normal strength in all extremities.  Sensory:  Fine touch, pinprick and vibration were tested in all extremities.  Proprioception is tested in the upper extremities and normal.  Coordination: Rapid alternating movements in the fingers/hands is tested and normal. He has finger arthritis  Finger-to-nose maneuver tested and normal without evidence of ataxia, dysmetria or tremor.  Gait and station: Patient walks without assistive device and is able and assisted stool climb up to the exam table. Strength within normal limits. Stance is  stable and normal.  Steps are unfragmented. Romberg testing is normal.  Deep tendon reflexes: in the  upper and lower extremities are symmetric and intact. Babinski maneuver response is downgoing.   Assessment:  After physical and neurologic examination, review of laboratory studies, imaging, neurophysiology testing and pre-existing records, assessment is   1) Patient with severe  complex apnea in 2006 , now in need of a new machine .  Report from [facility] in 11-2005 reviewed , Dr. Brett Fairy.  Patient risk factors are the same, high grade Mallampati, BMI and neck size, and symptoms of sleep deprivation since machine broke.   Plan:  Treatment plan and additional workup :  2) SPLIT study needed, Score at 4% and AHI at 15 to split.  CO2 needed.

## 2014-05-16 NOTE — Patient Instructions (Addendum)
CPAP and BIPAP Information CPAP and BIPAP are methods of helping you breathe with the use of air pressure. CPAP stands for "continuous positive airway pressure." BIPAP stands for "bi-level positive airway pressure." In both methods, air is blown into your air passages to help keep you breathing well. With CPAP, the amount of pressure stays the same while you breathe in and out. CPAP is most commonly used for obstructive sleep apnea. For obstructive sleep apnea, CPAP works by holding your airways open so that they do not collapse when your muscles relax during sleep. BIPAP is similar to CPAP except the amount of pressure is increased when you inhale. This helps you take larger breaths. Your health care provider will recommend whether CPAP or BIPAP would be more helpful for you.  WHY ARE CPAP AND BIPAP TREATMENTS USED? CPAP or BIPAP can be helpful if you have:   Sleep apnea.   Chronic obstructive pulmonary disease (COPD).   Diseases that weaken the muscles of the chest, including muscular dystrophy or neurological diseases such as amyotrophic lateral sclerosis (ALS).   Other problems that cause breathing to be weak, abnormal, or difficult.  HOW IS CPAP OR BIPAP ADMINISTERED? Both CPAP and BIPAP are provided by a small machine with a flexible plastic tube that attaches to a plastic mask. The mask fits on your face, and air is blown into your air passages through your nose or mouth. The amount of pressure that is used to blow the air into your air passages can be set on the machine. Your health care provider will determine the pressure setting that should be used based on your individual needs.  WHEN SHOULD CPAP OR BIPAP BE USED? In most cases, the mask is worn only when sleeping. Generally, you will need to wear the mask throughout the night and during the daytime if you take a nap. In a few cases involving certain medical conditions, people also need to wear the mask at other times when they are  awake. Follow your health care provider's instructions for when to use the machine.  USING THE MASK  Because the mask needs to be snug, some people feel a trapped or closed-in feeling (claustrophobic) when first using the mask. You may need to get used to the mask gradually. To do this, you can first hold the mask loosely over your nose or mouth. Gradually apply the mask more snugly. You can also gradually increase the amount of time that you use the mask.   Masks are available in various types and sizes. Some fit over your mouth and nose, and some fit over just your nose. If your mask does not fit well, talk to your health care provider about getting a different one.  If you are using a nasal mask and you tend to breathe through your mouth, a chin strap may be applied to help keep your mouth closed.   The CPAP and BIPAP machines have alarms that may sound if the mask comes off or develops a leak.   If you have trouble with the mask, it is very important that you talk to your health care provider about finding a way to make the mask easier to tolerate. Do not stop using the mask. This could have a negative impact on your health. TIPS FOR USING THE MACHINE  Place your CPAP or BIPAP machine on a secure table or stand near an electrical outlet.   Know where the on-off switch is located on the machine.     Follow your health care provider's instructions for how to set the pressure on your machine and when you should use it.   Do not eat or drink while the CPAP or BIPAP machine is on. Food or fluids could get pushed into your lungs by the pressure of the CPAP or BIPAP.  Do not smoke. Tobacco smoke residue can damage the machine.   For home use, CPAP and BIPAP machines can be rented or purchased through home health care companies. Many different brands of machines are available. Renting a machine before purchasing may help you find out which particular machine works well for you. SEEK  IMMEDIATE MEDICAL CARE IF:  You have redness or open areas around your nose or mouth where the mask fits.   You have trouble operating the CPAP or BIPAP machine.   You cannot tolerate wearing the CPAP or BIPAP mask.  Document Released: 08/27/2004 Document Revised: 08/01/2013 Document Reviewed: 06/28/2013 [facility] Patient Information 2014 [place], [place]. Sleep Apnea  Sleep apnea is a sleep disorder characterized by abnormal pauses in breathing while you sleep. When your breathing pauses, the level of oxygen in your blood decreases. This causes you to move out of deep sleep and into light sleep. As a result, your quality of sleep is poor, and the system that carries your blood throughout your body (cardiovascular system) experiences stress. If sleep apnea remains untreated, the following conditions can develop:  High blood pressure (hypertension).  Coronary artery disease.  Inability to achieve or maintain an erection (impotence).  Impairment of your thought process (cognitive dysfunction). There are three types of sleep apnea: 1. Obstructive sleep apnea Pauses in breathing during sleep because of a blocked airway. 2. Central sleep apnea Pauses in breathing during sleep because the area of the brain that controls your breathing does not send the correct signals to the muscles that control breathing. 3. Mixed sleep apnea A combination of both obstructive and central sleep apnea. RISK FACTORS The following risk factors can increase your risk of developing sleep apnea:  Being overweight.  Smoking.  Having narrow passages in your nose and throat.  Being of older age.  Being male.  Alcohol use.  Sedative and tranquilizer use.  Ethnicity. Among individuals younger than 35 years, African Americans are at increased risk of sleep apnea. SYMPTOMS   Difficulty staying asleep.  Daytime sleepiness and fatigue.  Loss of energy.  Irritability.  Loud, heavy snoring.  Morning  headaches.  Trouble concentrating.  Forgetfulness.  Decreased interest in sex. DIAGNOSIS  In order to diagnose sleep apnea, your caregiver will perform a physical examination. Your caregiver may suggest that you take a home sleep test. Your caregiver may also recommend that you spend the night in a sleep lab. In the sleep lab, several monitors record information about your heart, lungs, and brain while you sleep. Your leg and arm movements and blood oxygen level are also recorded. TREATMENT The following actions may help to resolve mild sleep apnea:  Sleeping on your side.   Using a decongestant if you have nasal congestion.   Avoiding the use of depressants, including alcohol, sedatives, and narcotics.   Losing weight and modifying your diet if you are overweight. There also are devices and treatments to help open your airway:  Oral appliances. These are custom-made mouthpieces that shift your lower jaw forward and slightly open your bite. This opens your airway.  Devices that create positive airway pressure. This positive pressure "splints" your airway open to help you breathe  better during sleep. The following devices create positive airway pressure:  Continuous positive airway pressure (CPAP) device. The CPAP device creates a continuous level of air pressure with an air pump. The air is delivered to your airway through a mask while you sleep. This continuous pressure keeps your airway open.  Nasal expiratory positive airway pressure (EPAP) device. The EPAP device creates positive air pressure as you exhale. The device consists of single-use valves, which are inserted into each nostril and held in place by adhesive. The valves create very little resistance when you inhale but create much more resistance when you exhale. That increased resistance creates the positive airway pressure. This positive pressure while you exhale keeps your airway open, making it easier to breath when you  inhale again.  Bilevel positive airway pressure (BPAP) device. The BPAP device is used mainly in patients with central sleep apnea. This device is similar to the CPAP device because it also uses an air pump to deliver continuous air pressure through a mask. However, with the BPAP machine, the pressure is set at two different levels. The pressure when you exhale is lower than the pressure when you inhale.  Surgery. Typically, surgery is only done if you cannot comply with less invasive treatments or if the less invasive treatments do not improve your condition. Surgery involves removing excess tissue in your airway to create a wider passage way. Document Released: 11/19/2002 Document Revised: 03/26/2013 Document Reviewed: 04/06/2012 [facility] Patient Information 2014 [place].

## 2014-05-27 ENCOUNTER — Ambulatory Visit (INDEPENDENT_AMBULATORY_CARE_PROVIDER_SITE_OTHER): Payer: Medicare Other | Admitting: Neurology

## 2014-05-27 DIAGNOSIS — Z9989 Dependence on other enabling machines and devices: Principal | ICD-10-CM

## 2014-05-27 DIAGNOSIS — I10 Essential (primary) hypertension: Secondary | ICD-10-CM

## 2014-05-27 DIAGNOSIS — G4733 Obstructive sleep apnea (adult) (pediatric): Secondary | ICD-10-CM

## 2014-05-27 DIAGNOSIS — E669 Obesity, unspecified: Secondary | ICD-10-CM

## 2014-06-11 ENCOUNTER — Telehealth: Payer: Self-pay | Admitting: Neurology

## 2014-06-11 ENCOUNTER — Encounter: Payer: Self-pay | Admitting: *Deleted

## 2014-06-11 DIAGNOSIS — G4733 Obstructive sleep apnea (adult) (pediatric): Secondary | ICD-10-CM

## 2014-06-11 NOTE — Telephone Encounter (Signed)
I called and spoke with the patient's spouse about his recent sleep study results. I informed the patient's spouse that the study revealed obstructive sleep apnea and that he did well on CPAP during the night of his study. Dr. Brett Fairy recommend starting CPAP therapy at home. I will send the order to [facility]. I will fax a copy of the report to Dr. Danna Hefty office and mail a copy to the patient along with a follow up instruction letter.

## 2014-08-28 ENCOUNTER — Encounter: Payer: Self-pay | Admitting: Neurology

## 2014-09-02 ENCOUNTER — Encounter: Payer: Self-pay | Admitting: Neurology

## 2014-09-02 ENCOUNTER — Encounter (INDEPENDENT_AMBULATORY_CARE_PROVIDER_SITE_OTHER): Payer: Self-pay

## 2014-09-02 ENCOUNTER — Ambulatory Visit (INDEPENDENT_AMBULATORY_CARE_PROVIDER_SITE_OTHER): Payer: Medicare Other | Admitting: Neurology

## 2014-09-02 VITALS — BP 116/75 | HR 86 | Resp 18 | Ht 65.25 in | Wt 256.0 lb

## 2014-09-02 DIAGNOSIS — Z9989 Dependence on other enabling machines and devices: Principal | ICD-10-CM

## 2014-09-02 DIAGNOSIS — R0902 Hypoxemia: Secondary | ICD-10-CM | POA: Insufficient documentation

## 2014-09-02 DIAGNOSIS — G4761 Periodic limb movement disorder: Secondary | ICD-10-CM

## 2014-09-02 DIAGNOSIS — G4733 Obstructive sleep apnea (adult) (pediatric): Secondary | ICD-10-CM

## 2014-09-02 HISTORY — DX: Hypoxemia: R09.02

## 2014-09-02 HISTORY — DX: Periodic limb movement disorder: G47.61

## 2014-09-02 MED ORDER — ROPINIROLE HCL 0.25 MG PO TABS: 0.2500 mg | ORAL_TABLET | Freq: Every day | ORAL | Status: DC

## 2014-09-02 NOTE — Progress Notes (Signed)
[facility]  Provider:  Larey Seat, [place] D  Referring Provider: Tivis Ringer, MD Primary Care Physician:  Tivis Ringer, MD  Chief Complaint  Patient presents with  . Follow-up    Room 11  . Sleep Apnea    HPI:  ALGER KERSTEIN is a married , caucasian  73 y.o. male , who  is seen here as a referral  from Dr. Dagmar Hait for a sleep apnea evaluation.  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 He was evaluated for sleep apnea at the age of 67 in December 2006. His sleep study at the [facility] documented an AHI of 58 the patient did not benefit from CPAP he used at home and returned for  titration to  Adapt- VPAP machine in January 2007.   VPAP machine was prescribed at the time with an EEP of 9 cm water and a pressure support setting between 3 and 10 cm water.  The patient states that he felt for many years well treated under these pressure settings. However his machine now is not longer functioning well he does note a supply him with the air he crates, it'll spontaneously switch off in the middle of nocturnal use. The download is not obtainable from the machine this age. The machine seems not longer to hold pressure.  He was followed by a durable medical equipment company "sleep med" but then as he became an Medicare patient please contact change to [employer] her. The patient was then referred for a new sleep apnea evaluation to document the current degree of apnea and if present to be titrate as of now on needed pressures. His current sleep habits are as follows- he goes to bed at 11PM but needs about 20-30 minutes to initiate sleep , waking up frequently , unsure why he wakes . He sleeps for the longest uninterrupted time about 2 hours. He has 3 nocturia breaks on average. He will assist his wife with some water etc.  He sleeps only 4-5 hours at the most. He wakes up spontaneously, needs only  15 minutes to rise and get ready.  No Alarm used.  He drinks decaffeinated tea and coffee, cola with caffeine 3-4 a week.  His morning is characterized by a dry mouth, but no headaches.    He craves 6-7 hours of sleep as he used to have when his machine worked well.  He went to the gym 4 times weekly, since retirement in 03-2013 , but his wife illness now makes this impossible. He naps now in daytime, power-naps in the afternoon,of 30 minutes or less, feeling refreshed after wards.  He has had no sinus surgery, no tonsillectomy and no septal correction.   His son has sleep apnea and used CPAP.  Interval history : on 05-27-14 [name] underwent a split night polysomnography.  His AHI was 44.6 his RDI 52.9 REM sleep was not seen in the first 2 hours. Interestingly supine in his sleep apnea index was lower than and when he slept on the right. The patient also had 232 periodic limb movements with arousal and index of 9.8. Once on CPAP, he was titrated to 10 cm water pressure ,but he seemed to have done the best at 9 cm his AHI was reduced significantly. Oxygen nadir was not 83% but still with 31 minutes of desaturation. His PLM arousal index was still 13.6 saw CPAP but not necessarily  address the issue. The AHI at 9 cm water pressure was 0.0. Unable today to see a download for the last 67 days since initially starting on in the machine. The patient had been using CPAP in the past and prefers a full face mask. His average time date using the machine is 5 hours and 52 minutes, his EPI level is still a stat pressure is 9 he still has an AHI of 9.4. He attributes this to a rather high air leak. The residual AHI on 9 cm water pressure right now is not satisfied is not satisfying as a school to 10 per hour. He is on air sense auto set machine to [facility] I would like to and a pressure window between 7 and 12 cm water and allowed the patient to use the mask he prefers, which is a full face mask. Again he  is used to sleep on the side and he uses a special CPAP pillow,  configured to allow the mask to stay in place    Review of Systems: Out of a complete 14 system review, the patient complains of only the following symptoms, and all other reviewed systems are negative. Mr. Danford endorsed  the depression score at 0 points ( remained the same as pre CPAP ), the Epworth sleepiness scale remained at  13 points- and the fatigue severity scale at 14 from 18 points.  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  He stated that he snores not longer when on CPAP but is bothered by an oral air leak, feels the nasal mask is not fitted well. He not longer has tinnitus-  ENT procedure to clean excessive wax and ear drops.  Weight gain over 25 pounds in the last 10-12 month.     History   Social History  . Marital Status: Married    Spouse Name: Vaughan Basta    Number of Children: 4  . Years of Education: 13   Occupational History  . Not on file.   Social History Main Topics  . Smoking status: Former Smoker    Quit date: 01/20/1983  . Smokeless tobacco: Never Used  . Alcohol Use: No  . Drug Use: No  . Sexual Activity: Not on file   Other Topics Concern  . Not on file   Social History Narrative   Patient is married Vaughan Basta) and lives at home with his wife.   Patient has four adult children.   Patient is retired.   Patient has a college education.   Patient is right-handed.   Patient drinks three-four sodas per week.    Family History  Problem Relation Age of Onset  . Glaucoma Father   . Alzheimer's disease Mother   . Dementia Mother   . Heart Problems Maternal Grandfather   . Heart Problems Maternal Grandmother     Past Medical History  Diagnosis Date  . Hypertension   . Sleep apnea     uses a cpap  . Hyperlipemia   . Seasonal allergies   . Arthritis   . OSA on CPAP     Past Surgical History  Procedure Laterality Date  . Joint replacement       bilateral knee replacemts  . Joint replacement      bilateral shoulder replacemts  . Knee arthroscopy      right and left  . Total knee arthroplasty  2005    right  . Total knee arthroplasty  2004    left  . Total shoulder arthroplasty  2012    left  . Shoulder arthroscopy  2011    right  . Bunionectomy Right 01/03/2014    Procedure: RIGHT LAPIDUS BUNION CORRECTION;  Surgeon: Wylene Simmer, MD;  Location: [facility];  Service: Orthopedics;  Laterality: Right;    Current Outpatient Prescriptions  Medication Sig Dispense Refill  . amLODipine (NORVASC) 10 MG tablet Take 10 mg by mouth every morning.      Marland Kitchen aspirin 325 MG EC tablet Take 325 mg by mouth daily.      Marland Kitchen atorvastatin (LIPITOR) 20 MG tablet Take 20 mg by mouth every evening.      . benazepril (LOTENSIN) 40 MG tablet Take 40 mg by mouth every morning.      . fluticasone (FLONASE) 50 MCG/ACT nasal spray Place 2 sprays into the nose daily.      Marland Kitchen ofloxacin (FLOXIN) 0.3 % otic solution As needed      . oxyCODONE (ROXICODONE) 5 MG immediate release tablet Take 1-2 tablets (5-10 mg total) by mouth every 4 (four) hours as needed.  30 tablet  0  . timolol (TIMOPTIC) 0.5 % ophthalmic solution 1 drop 2 (two) times daily. Left eye      . venlafaxine XR (EFFEXOR-XR) 75 MG 24 hr capsule Take 225 mg by mouth every morning.       No current facility-administered medications for this visit.    Allergies as of 09/02/2014  . (No Known Allergies)    Vitals: BP 116/75  Pulse 86  Resp 18  Ht 5' 5.25" (1.657 m)  Wt 256 lb (116.121 kg)  BMI 42.29 kg/m2 Last Weight:  Wt Readings from Last 1 Encounters:  09/02/14 256 lb (116.121 kg)   Last Height:   Ht Readings from Last 1 Encounters:  09/02/14 5' 5.25" (1.657 m)   BMI  .  Physical exam:  General: The patient is awake, alert and appears not in acute distress. The patient is well groomed. Head: Normocephalic, atraumatic. Neck is supple. Mallampati 4-5 invisible uvula   , neck circumference:20  inches, no TMJ , no dentures, no retrognathia.  Short, thick neck .  Cardiovascular:  Regular rate and rhythm , without  murmurs or carotid bruit, and without distended neck veins. Respiratory: Lungs are clear to auscultation. [place] chested. Skin:  Without evidence of edema, or rash Trunk: BMI is  elevated and patient  has normal posture.  Neurologic exam : The patient is awake and alert, oriented to place and time.  Memory subjective   described as intact. There is a normal attention span & concentration ability.  Speech is fluent without   dysarthria, dysphonia or aphasia. Mood and affect are appropriate.  Cranial nerves: Pupils are equal and briskly reactive to light. Funduscopic exam without  evidence of pallor or edema.  Extraocular movements  in vertical and horizontal planes intact and without nystagmus. Visual fields by finger perimetry are intact. Hearing to finger rub intact.  Facial sensation intact to fine touch. Facial motor strength is symmetric and tongue and uvula move midline.  Motor exam:  Normal tone and normal muscle bulk and symmetric normal strength in all extremities.  Sensory:  Fine touch, pinprick and vibration were tested in all extremities.  Proprioception is tested in the upper extremities and normal.  Coordination: Rapid alternating movements in the fingers/hands is tested and normal. He has finger arthritis  Finger-to-nose maneuver tested and normal without evidence of ataxia,  dysmetria or tremor.  Gait and station: Patient walks without assistive device and is able and assisted stool climb up to the exam table. Strength within normal limits. Stance is stable and normal.  Steps are unfragmented. Romberg testing is normal.  Deep tendon reflexes: in the  upper and lower extremities are symmetric and intact. Babinski maneuver response is downgoing.   Assessment:  After physical and neurologic examination, review of laboratory  studies, imaging, neurophysiology testing and pre-existing records, assessment is   1) Patient with severe  complex apnea in 2006 , now in need of a new machine .  He was diagnosed with severe apnea, mostly obstructive on 6-15 -15 , AHI 44.5 @ [facility]. Titrated to 9cm with air-sense machine  , but download showed a high residual AHI.  Need to change to auto-set. Patient would like to use a FFM.  Report from [facility] in 11-2005 reviewed . Patient risk factors are the same, high grade Mallampati, BMI and neck size, and he now has documented PLMs, arousals at 12 hoarseness.     Plan:  Treatment plan and additional workup :  Continue CPAP but change to patients mask of choice - FFM and setting will change to auto titration 8-12 cm water. DME [facility]. Start a RLS medication at low dose. 0.25 mg requip. Rv with Megan in 6 month. Compliance and RLS follow up.

## 2014-09-02 NOTE — Patient Instructions (Signed)

## 2014-09-09 ENCOUNTER — Encounter: Payer: Self-pay | Admitting: Internal Medicine

## 2015-03-04 ENCOUNTER — Encounter: Payer: Self-pay | Admitting: Adult Health

## 2015-03-04 ENCOUNTER — Ambulatory Visit (INDEPENDENT_AMBULATORY_CARE_PROVIDER_SITE_OTHER): Payer: Medicare Other | Admitting: Adult Health

## 2015-03-04 VITALS — BP 129/82 | HR 98 | Ht 65.5 in | Wt 251.0 lb

## 2015-03-04 DIAGNOSIS — G4733 Obstructive sleep apnea (adult) (pediatric): Secondary | ICD-10-CM

## 2015-03-04 DIAGNOSIS — Z9989 Dependence on other enabling machines and devices: Principal | ICD-10-CM

## 2015-03-04 NOTE — Progress Notes (Signed)
I agree with the assessment and plan as directed by NP .The patient is known to me .   Qamar Aughenbaugh, MD  

## 2015-03-04 NOTE — Progress Notes (Signed)
PATIENT: Tommy HERITAGE DOB: 1941/11/05  REASON FOR VISIT: follow up- objective sleep apnea on CPAP HISTORY FROM: patient  HISTORY OF PRESENT ILLNESS: Mr. Spagnolo is a 74 year old male with a history of obstructive sleep apnea. He returns today for a compliance download. He currently uses the machine 27 out of 30 days for a compliance of 90%. However he only uses the machine greater than 4 hours 19/30 days for a compliance of 63%. On average he uses the machine 5 hours and 7 minutes. The patient's AHI is 5.3 at 9 cm of water with EPR of 2. His Epworth score is 9 was previously 13 and fatigue severity score is 14 was previously 14-18. Patient states that he has to bed around 11 PM and arises at 7 AM. He denies any trouble falling asleep. He states that he gets up approximately 2 times a night to urinate. Other than that he is able to sleep through the night. He states he does have a habit of taking his mask off if he wakes up during the night. He contributes this to the reason he did not use it for greater than 4 hours consistently. He states he did switch to the full face mask and he is able to tolerate that better. Overall he feels that the CPAP has been beneficial. He does state that his wife passed away last year. He states that he overall is doing better. He returns today for an evaluation.  HISTORY 09/02/14 [facility]): ARAF CLUGSTON is a married , caucasian 74 y.o. male , who is seen here as a referral from Dr. Dagmar Hait for a sleep apnea evaluation.  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 He was evaluated for sleep apnea at the age of 18 in December 2006. His sleep study at the [facility] documented an AHI of 58 the patient did not benefit from CPAP he used at home and returned for titration to [employer]- VPAP machine in January 2007.  VPAP machine was prescribed at the time with an EEP of 9 cm water and a pressure support setting  between 3 and 10 cm water.  The patient states that he felt for many years well treated under these pressure settings. However his machine now is not longer functioning well he does note a supply him with the air he crates, it'll spontaneously switch off in the middle of nocturnal use. The download is not obtainable from the machine this age. The machine seems not longer to hold pressure.  He was followed by a durable medical equipment company "sleep med" but then as he became an Medicare patient please contact change to [facility]. The patient was then referred for a new sleep apnea evaluation to document the current degree of apnea and if present to be titrate as of now on needed pressures. His current sleep habits are as follows- he goes to bed at 11PM but needs about 20-30 minutes to initiate sleep , waking up frequently , unsure why he wakes . He sleeps for the longest uninterrupted time about 2 hours. He has 3 nocturia breaks on average. He will assist his wife with some water etc.  He sleeps only 4-5 hours at the most. He wakes up spontaneously, needs only 15 minutes to rise and get ready. No Alarm used. He drinks decaffeinated tea and coffee, cola with caffeine 3-4 a week.  His morning is characterized by a dry mouth, but  no headaches.    He craves 6-7 hours of sleep as he used to have when his machine worked well. He went to the gym 4 times weekly, since retirement in 03-2013 , but his wife illness now makes this impossible. He naps now in daytime, power-naps in the afternoon,of 30 minutes or less, feeling refreshed after wards.  He has had no sinus surgery, no tonsillectomy and no septal correction.   His son has sleep apnea and used CPAP.  Interval history : on 05-27-14 [name] underwent a split night polysomnography.  His AHI was 44.6 his RDI 52.9 REM sleep was not seen in the first 2 hours. Interestingly supine in his sleep apnea index was lower than and when he slept on the  right. The patient also had 232 periodic limb movements with arousal and index of 9.8. Once on CPAP, he was titrated to 10 cm water pressure ,but he seemed to have done the best at 9 cm his AHI was reduced significantly. Oxygen nadir was not 83% but still with 31 minutes of desaturation. His PLM arousal index was still 13.6 saw CPAP but not necessarily address the issue. The AHI at 9 cm water pressure was 0.0. Unable today to see a download for the last 67 days since initially starting on in the machine. The patient had been using CPAP in the past and prefers a full face mask. His average time date using the machine is 5 hours and 52 minutes, his EPI level is still a stat pressure is 9 he still has an AHI of 9.4. He attributes this to a rather high air leak. The residual AHI on 9 cm water pressure right now is not satisfied is not satisfying as a school to 10 per hour. He is on air sense auto set [facility] to [facility] I would like to and a pressure window between 7 and 12 cm water and allowed the patient to use the mask he prefers, which is a full face mask. Again he is used to sleep on the side and he uses a special CPAP pillow, configured to allow the mask to stay in place\     REVIEW OF SYSTEMS: Out of a complete 14 system review of symptoms, the patient complains only of the following symptoms, and all other reviewed systems are negative.  Cough  ALLERGIES: No Known Allergies  HOME MEDICATIONS: Outpatient Prescriptions Prior to Visit  Medication Sig Dispense Refill  . amLODipine (NORVASC) 10 MG tablet Take 10 mg by mouth every morning.    Marland Kitchen aspirin 325 MG EC tablet Take 325 mg by mouth daily.    Marland Kitchen atorvastatin (LIPITOR) 20 MG tablet Take 20 mg by mouth every evening.    . benazepril (LOTENSIN) 40 MG tablet Take 40 mg by mouth every morning.    . fluticasone (FLONASE) 50 MCG/ACT nasal spray Place 2 sprays into the nose daily.    Marland Kitchen ofloxacin (FLOXIN) 0.3 % otic solution As needed      . oxyCODONE (ROXICODONE) 5 MG immediate release tablet Take 1-2 tablets (5-10 mg total) by mouth every 4 (four) hours as needed. 30 tablet 0  . rOPINIRole (REQUIP) 0.25 MG tablet Take 1 tablet (0.25 mg total) by mouth at bedtime. 30 tablet 5  . timolol (TIMOPTIC) 0.5 % ophthalmic solution 1 drop 2 (two) times daily. Left eye    . venlafaxine XR (EFFEXOR-XR) 75 MG 24 hr capsule Take 225 mg by mouth every morning.  No facility-administered medications prior to visit.    PAST MEDICAL HISTORY: Past Medical History  Diagnosis Date  . Hypertension   . Sleep apnea     uses a cpap  . Hyperlipemia   . Seasonal allergies   . Arthritis   . OSA on CPAP   . PLMD (periodic limb movement disorder) 09/02/2014  . Hypoxemia 09/02/2014    PAST SURGICAL HISTORY: Past Surgical History  Procedure Laterality Date  . Joint replacement      bilateral knee replacemts  . Joint replacement      bilateral shoulder replacemts  . Knee arthroscopy      right and left  . Total knee arthroplasty  2005    right  . Total knee arthroplasty  2004    left  . Total shoulder arthroplasty  2012    left  . Shoulder arthroscopy  2011    right  . Bunionectomy Right 01/03/2014    Procedure: RIGHT LAPIDUS BUNION CORRECTION;  Surgeon: Wylene Simmer, MD;  Location: [facility];  Service: Orthopedics;  Laterality: Right;    FAMILY HISTORY: Family History  Problem Relation Age of Onset  . Glaucoma Father   . Alzheimer's disease Mother   . Dementia Mother   . Heart Problems Maternal Grandfather   . Heart Problems Maternal Grandmother     SOCIAL HISTORY: History   Social History  . Marital Status: Married    Spouse Name: Vaughan Basta  . Number of Children: 4  . Years of Education: 13   Occupational History  . Not on file.   Social History Main Topics  . Smoking status: Former Smoker    Quit date: 01/20/1983  . Smokeless tobacco: Never Used  . Alcohol Use: No  . Drug Use: No  . Sexual  Activity: Not on file   Other Topics Concern  . Not on file   Social History Narrative   Patient is married Vaughan Basta) and lives at home with his wife.   Patient has four adult children.   Patient is retired.   Patient has a college education.   Patient is right-handed.   Patient drinks three-four sodas per week.      PHYSICAL EXAM  Filed Vitals:   03/04/15 1332  BP: 129/82  Pulse: 98  Height: 5' 5.5" (1.664 m)  Weight: 251 lb (113.853 kg)   Body mass index is 41.12 kg/(m^2).  Generalized: Well developed, in no acute distress  Neck: Circumference 19 inches, Mallampati 4+  Neurological examination  Mentation: Alert oriented to time, place, history taking. Follows all commands speech and language fluent Cranial nerve II-XII: Pupils were equal round reactive to light. Extraocular movements were full, visual field were full on confrontational test. Facial sensation and strength were normal. Uvula tongue midline. Head turning and shoulder shrug  were normal and symmetric. Motor: The motor testing reveals 5 over 5 strength of all 4 extremities. Good symmetric motor tone is noted throughout.  Sensory: Sensory testing is intact to soft touch on all 4 extremities. No evidence of extinction is noted.  Coordination: Cerebellar testing reveals good finger-nose-finger and heel-to-shin bilaterally.  Gait and station: Gait is normal.  Reflexes: Deep tendon reflexes are symmetric and normal bilaterally.    DIAGNOSTIC DATA (LABS, IMAGING, TESTING) - I reviewed patient records, labs, notes, testing and imaging myself where available.   ASSESSMENT AND PLAN 74 y.o. year old male  has a past medical history of Hypertension; Sleep apnea; Hyperlipemia; Seasonal allergies; Arthritis; OSA on  CPAP; PLMD (periodic limb movement disorder) (09/02/2014); and Hypoxemia (09/02/2014). here with:  1. Obstructive sleep apnea on CPAP  The patient's compliance download was good today. I have encouraged the  patient to begin using his machine greater than 4 hours more consistently. His AHI is slightly elevated at 5.3 however this may improve if he uses his CPAP more often. Patient verbalized understanding. He will return in 3-4 months for another download.   Ward Givens, MSN, NP-C 03/04/2015, 1:32 PM [facility] [address], [place], [place] [address] [phone]  Note: This document was prepared with digital dictation and possible smart phrase technology. Any transcriptional errors that result from this process are unintentional.

## 2015-03-04 NOTE — Patient Instructions (Signed)
Continue CPAP nightly. Try using the machine for >4 hours each night.

## 2015-06-26 ENCOUNTER — Encounter: Payer: Self-pay | Admitting: Adult Health

## 2015-07-03 ENCOUNTER — Encounter: Payer: Self-pay | Admitting: Adult Health

## 2015-07-03 ENCOUNTER — Ambulatory Visit (INDEPENDENT_AMBULATORY_CARE_PROVIDER_SITE_OTHER): Payer: Medicare Other | Admitting: Adult Health

## 2015-07-03 VITALS — BP 115/69 | HR 80 | Ht 66.0 in | Wt 252.0 lb

## 2015-07-03 DIAGNOSIS — G4733 Obstructive sleep apnea (adult) (pediatric): Secondary | ICD-10-CM | POA: Diagnosis not present

## 2015-07-03 DIAGNOSIS — Z9989 Dependence on other enabling machines and devices: Principal | ICD-10-CM

## 2015-07-03 NOTE — Progress Notes (Signed)
I agree with the assessment and plan as directed by NP .The patient is known to me .   Lynore Coscia, MD  

## 2015-07-03 NOTE — Progress Notes (Signed)
PATIENT: Tommy [name] DOB: 04-08-1941  REASON FOR VISIT: follow up- obstructive sleep apnea on CPAP HISTORY FROM: patient  HISTORY OF PRESENT ILLNESS: [name] is a 74 year old male with a history of obstructive sleep apnea on CPAP. He returns today for an evaluation. His CPAP download shows that he uses his machine for 29 out of 30 days for compliance of 97%. He uses the machine greater than 4 hours 27 out of 30 days for compliance of 90%. On average he uses his machine 7 hours and 20 minutes. His AHI is 2.7 on 9cmH20 of water with EPR of 2. The patient has a leak in the 95th percentile at 31.9L/min. patient states that he does not like to sleep on his back and when he turns to his sides that when the mask becomes dislodged. The patient also feels that the straps may be causing a rash. He usually goes to bed around 10:30 PM and arises at 7:30 AM. His Epworth sleepiness score is 11 was previously 13. Fatigue severity score is 22 was previously 14. The patient states that he is currently under a lot of stress. He is in the process of moving. He returns today for an evaluation.  HISTORY 09/02/14 (CD): Tommy [name] is a married , caucasian 74 y.o. male , who is seen here as a referral from Dr. Dagmar Hait for a sleep apnea evaluation.  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 He was evaluated for sleep apnea at the age of 66 in December 2006. His sleep study at the [facility] documented an AHI of 58 the patient did not benefit from CPAP he used at home and returned for titration to [employer]- VPAP machine in January 2007.  VPAP machine was prescribed at the time with an EEP of 9 cm water and a pressure support setting between 3 and 10 cm water.  The patient states that he felt for many years well treated under these pressure settings. However his machine now is not longer functioning well he does note a supply him with the air he crates,  it'll spontaneously switch off in the middle of nocturnal use. The download is not obtainable from the machine this age. The machine seems not longer to hold pressure.  He was followed by a durable medical equipment company "sleep med" but then as he became an Medicare patient please contact change to [employer] [facility]. The patient was then referred for a new sleep apnea evaluation to document the current degree of apnea and if present to be titrate as of now on needed pressures. His current sleep habits are as follows- he goes to bed at 11PM but needs about 20-30 minutes to initiate sleep , waking up frequently , unsure why he wakes . He sleeps for the longest uninterrupted time about 2 hours. He has 3 nocturia breaks on average. He will assist his wife with some water etc.  He sleeps only 4-5 hours at the most. He wakes up spontaneously, needs only 15 minutes to rise and get ready. No Alarm used. He drinks decaffeinated tea and coffee, cola with caffeine 3-4 a week.  His morning is characterized by a dry mouth, but no headaches.    He craves 6-7 hours of sleep as he used to have when his machine worked well. He went to the gym 4 times weekly, since retirement in 03-2013 , but his wife illness now makes this impossible. He  naps now in daytime, power-naps in the afternoon,of 30 minutes or less, feeling refreshed after wards.  He has had no sinus surgery, no tonsillectomy and no septal correction.   His son has sleep apnea and used CPAP.  Interval history : on 05-27-14 Mr. Tommy [name] underwent a split night polysomnography.  His AHI was 44.6 his RDI 52.9 REM sleep was not seen in the first 2 hours. Interestingly supine in his sleep apnea index was lower than and when he slept on the right. The patient also had 232 periodic limb movements with arousal and index of 9.8. Once on CPAP, he was titrated to 10 cm water pressure ,but he seemed to have done the best at 9 cm his AHI was reduced significantly. Oxygen  nadir was not 83% but still with 31 minutes of desaturation. His PLM arousal index was still 13.6 saw CPAP but not necessarily address the issue. The AHI at 9 cm water pressure was 0.0. Unable today to see a download for the last 67 days since initially starting on in the machine. The patient had been using CPAP in the past and prefers a full face mask. His average time date using the machine is 5 hours and 52 minutes, his EPI level is still a stat pressure is 9 he still has an AHI of 9.4. He attributes this to a rather high air leak. The residual AHI on 9 cm water pressure right now is not satisfied is not satisfying as a school to 10 per hour. He is on air sense auto set machine to [facility] I would like to and a pressure window between 7 and 12 cm water and allowed the patient to use the mask he prefers, which is a full face mask. Again he is used to sleep on the side and he uses a special CPAP pillow, configured to allow the mask to stay in place  REVIEW OF SYSTEMS: Out of a complete 14 system review of symptoms, the patient complains only of the following symptoms, and all other reviewed systems are negative.  See history of present illness  ALLERGIES: No Known Allergies  HOME MEDICATIONS: Outpatient Prescriptions Prior to Visit  Medication Sig Dispense Refill  . amLODipine (NORVASC) 10 MG tablet Take 10 mg by mouth every morning.    Marland Kitchen aspirin 325 MG EC tablet Take 325 mg by mouth daily.    Marland Kitchen atorvastatin (LIPITOR) 20 MG tablet Take 20 mg by mouth every evening.    . benazepril (LOTENSIN) 40 MG tablet Take 40 mg by mouth every morning.    . fluticasone (FLONASE) 50 MCG/ACT nasal spray Place 2 sprays into the nose daily.    Marland Kitchen ofloxacin (FLOXIN) 0.3 % otic solution As needed    . oxyCODONE (ROXICODONE) 5 MG immediate release tablet Take 1-2 tablets (5-10 mg total) by mouth every 4 (four) hours as needed. 30 tablet 0  . rOPINIRole (REQUIP) 0.25 MG tablet Take 1 tablet (0.25 mg total)  by mouth at bedtime. 30 tablet 5  . timolol (TIMOPTIC) 0.5 % ophthalmic solution 1 drop 2 (two) times daily. Left eye    . venlafaxine XR (EFFEXOR-XR) 75 MG 24 hr capsule Take 225 mg by mouth every morning.     No facility-administered medications prior to visit.    PAST MEDICAL HISTORY: Past Medical History  Diagnosis Date  . Hypertension   . Sleep apnea     uses a cpap  . Hyperlipemia   . Seasonal allergies   .  Arthritis   . OSA on CPAP   . PLMD (periodic limb movement disorder) 09/02/2014  . Hypoxemia 09/02/2014    PAST SURGICAL HISTORY: Past Surgical History  Procedure Laterality Date  . Joint replacement      bilateral knee replacemts  . Joint replacement      bilateral shoulder replacemts  . Knee arthroscopy      right and left  . Total knee arthroplasty  2005    right  . Total knee arthroplasty  2004    left  . Total shoulder arthroplasty  2012    left  . Shoulder arthroscopy  2011    right  . Bunionectomy Right 01/03/2014    Procedure: RIGHT LAPIDUS BUNION CORRECTION;  Surgeon: Wylene Simmer, MD;  Location: [facility];  Service: Orthopedics;  Laterality: Right;    FAMILY HISTORY: Family History  Problem Relation Age of Onset  . Glaucoma Father   . Alzheimer's disease Mother   . Dementia Mother   . Heart Problems Maternal Grandfather   . Heart Problems Maternal Grandmother     SOCIAL HISTORY: History   Social History  . Marital Status: Married    Spouse Name: Vaughan Basta  . Number of Children: 4  . Years of Education: 13   Occupational History  . Not on file.   Social History Main Topics  . Smoking status: Former Smoker    Quit date: 01/20/1983  . Smokeless tobacco: Never Used  . Alcohol Use: No  . Drug Use: No  . Sexual Activity: Not on file   Other Topics Concern  . Not on file   Social History Narrative   Patient lives at home alone and he is widowed.   Patient has four adult children.   Patient is retired.   Patient has a  college education.   Patient is right-handed.   Patient drinks three-four sodas per week.      PHYSICAL EXAM  Filed Vitals:   07/03/15 1002  BP: 115/69  Pulse: 80  Height: 5\' 6"  (1.676 m)  Weight: 252 lb (114.306 kg)   Body mass index is 40.69 kg/(m^2).  Generalized: Well developed, in no acute distress  Neck: Circumference 19 inches, Mallampati 4+  Neurological examination  Mentation: Alert oriented to time, place, history taking. Follows all commands speech and language fluent Cranial nerve II-XII: Pupils were equal round reactive to light. Extraocular movements were full, visual field were full on confrontational test. Facial sensation and strength were normal. Uvula tongue midline. Head turning and shoulder shrug  were normal and symmetric. Motor: The motor testing reveals 5 over 5 strength of all 4 extremities. Good symmetric motor tone is noted throughout.  Sensory: Sensory testing is intact to soft touch on all 4 extremities. No evidence of extinction is noted.  Coordination: Cerebellar testing reveals good finger-nose-finger and heel-to-shin bilaterally.  Gait and station: Gait is normal. Reflexes: Deep tendon reflexes are symmetric and normal bilaterally.   DIAGNOSTIC DATA (LABS, IMAGING, TESTING) - I reviewed patient records, labs, notes, testing and imaging myself where available.  ASSESSMENT AND PLAN 74 y.o. year old male  has a past medical history of Hypertension; Sleep apnea; Hyperlipemia; Seasonal allergies; Arthritis; OSA on CPAP; PLMD (periodic limb movement disorder) (09/02/2014); and Hypoxemia (09/02/2014). here with:  1. Obstructive sleep apnea on CPAP  At the patient's previous office visit it was requested that his settings be changed to AutoSet 8-12 cm of water. It does not appear that this has been completed.  I will send another order to [facility] and have my assistant call so the changes can be made. Also advised patient to follow-up with his DME  regarding his mask and straps. Patient verbalized understanding. He will follow-up in 6 months or sooner if needed.     Ward Givens, MSN, NP-C 07/03/2015, 9:58 AM [facility] [address] [place], [place] [address] [phone]  Note: This document was prepared with digital dictation and possible smart phrase technology. Any transcriptional errors that result from this process are unintentional.

## 2015-07-04 ENCOUNTER — Ambulatory Visit: Payer: Medicare Other | Admitting: Adult Health

## 2015-12-31 ENCOUNTER — Telehealth: Payer: Self-pay | Admitting: Neurology

## 2015-12-31 NOTE — Telephone Encounter (Signed)
Spoke to pt. He says [name] still has not changed the settings on his cpap. I advised him that the order was sent to them in July but I will fax it to them again. Pt verbalized understanding an appreciation.

## 2015-12-31 NOTE — Telephone Encounter (Signed)
Pt called said his pressure on CPAP needed to be increased per Megan last OV in July 16. Pt said setting have not been increased as of yet. Please call

## 2015-12-31 NOTE — Telephone Encounter (Signed)
Order faxed to [facility]

## 2016-01-05 ENCOUNTER — Ambulatory Visit: Payer: Medicare Other | Admitting: Neurology

## 2016-01-20 DIAGNOSIS — R42 Dizziness and giddiness: Secondary | ICD-10-CM | POA: Diagnosis not present

## 2016-01-20 DIAGNOSIS — I129 Hypertensive chronic kidney disease with stage 1 through stage 4 chronic kidney disease, or unspecified chronic kidney disease: Secondary | ICD-10-CM | POA: Diagnosis not present

## 2016-01-20 DIAGNOSIS — E784 Other hyperlipidemia: Secondary | ICD-10-CM | POA: Diagnosis not present

## 2016-01-20 DIAGNOSIS — Z6841 Body Mass Index (BMI) 40.0 and over, adult: Secondary | ICD-10-CM | POA: Diagnosis not present

## 2016-01-20 DIAGNOSIS — N529 Male erectile dysfunction, unspecified: Secondary | ICD-10-CM | POA: Diagnosis not present

## 2016-01-20 DIAGNOSIS — F39 Unspecified mood [affective] disorder: Secondary | ICD-10-CM | POA: Diagnosis not present

## 2016-01-20 DIAGNOSIS — R7301 Impaired fasting glucose: Secondary | ICD-10-CM | POA: Diagnosis not present

## 2016-01-20 DIAGNOSIS — G4739 Other sleep apnea: Secondary | ICD-10-CM | POA: Diagnosis not present

## 2016-01-21 ENCOUNTER — Encounter: Payer: Self-pay | Admitting: Neurology

## 2016-01-21 ENCOUNTER — Ambulatory Visit (INDEPENDENT_AMBULATORY_CARE_PROVIDER_SITE_OTHER): Payer: Medicare Other | Admitting: Neurology

## 2016-01-21 VITALS — BP 142/86 | HR 88 | Resp 20 | Ht 66.0 in | Wt 252.0 lb

## 2016-01-21 DIAGNOSIS — Z9989 Dependence on other enabling machines and devices: Principal | ICD-10-CM

## 2016-01-21 DIAGNOSIS — R0981 Nasal congestion: Secondary | ICD-10-CM | POA: Diagnosis not present

## 2016-01-21 DIAGNOSIS — G4733 Obstructive sleep apnea (adult) (pediatric): Secondary | ICD-10-CM | POA: Diagnosis not present

## 2016-01-21 DIAGNOSIS — R51 Headache: Secondary | ICD-10-CM | POA: Diagnosis not present

## 2016-01-21 DIAGNOSIS — M954 Acquired deformity of chest and rib: Secondary | ICD-10-CM

## 2016-01-21 DIAGNOSIS — R519 Headache, unspecified: Secondary | ICD-10-CM

## 2016-01-21 NOTE — Progress Notes (Signed)
PATIENT: Tommy [name] DOB: 10-May-1941  REASON FOR VISIT: follow up- obstructive sleep apnea on CPAP HISTORY FROM: patient  HISTORY OF PRESENT ILLNESS:  01-21-16; Tommy [name] is a meanwhile 75 year old male with a history of obstructive sleep apnea on CPAP. He returns today for a compliance visit . He lost his first wife after 66 years of marriage, has 4 sons. He remarried within a year, moved form his established [place]ighborhood out to [place].  His CPAP download shows that he uses his machine for 29 out of 30 days for compliance of 97%. He uses the machine greater than 4 hours 27 out of 30 days for compliance of 90%. On average he uses his machine 7 hours and 20 minutes. His AHI is 2.7 on 9 cm H20 of water with EPR of 2. The patient has a high air  leak in the 95th percentile at 31.9L/min.  patient states that he does not like to sleep on his back and when he turns to his sides that when the mask becomes dislodged. The patient also feels that the straps may be causing a rash. He usually goes to bed around 10:30 PM and arises at 7:30 AM. His Epworth sleepiness score is 11 was previously 13. Fatigue severity score is 22 was previously 14.     HISTORY 09/02/14 (CD): TYMEEK [name] is a married , caucasian 75 y.o. male , who is seen here as a referralfrom Dr. Dagmar Hait for a sleep apnea evaluation.  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 He was evaluated for sleep apnea at the age of 36 in December 2006. His sleep study at the [facility] documented an AHI of 58 the patient did not benefit from CPAP he used at home and returned for titration to [employer]- VPAP machine in January 2007.  VPAP machine was prescribed at the time with an EEP of 9 cm water and a pressure support setting between 3 and 10 cm water.  The patient states that he felt for many years well treated under these pressure settings. However his machine now is not longer  functioning well he does note a supply him with the air he crates, it'll spontaneously switch off in the middle of nocturnal use. The download is not obtainable from the machine this age. The machine seems not longer to hold pressure. He was followed by  "sleep med" but as he became an Medicare patient was referred for a new sleep apnea evaluation to document the current degree of apnea and if present to be titrate as of now on needed pressures. His current sleep habits are as follows- he goes to bed at 11PM but needs about 20-30 minutes to initiate sleep , waking up frequently , unsure why he wakes . He sleeps for the longest uninterrupted time about 2 hours. He has 3 nocturia breaks on average. He will assist his wife with some water etc.  He sleeps only 4-5 hours at the most. He wakes up spontaneously, needs only 15 minutes to rise and get ready. No Alarm used. He drinks decaffeinated tea and coffee, cola with caffeine 3-4 a week.  His morning is characterized by a dry mouth, but no headaches.  He craves 6-7 hours of sleep as he used to have when his machine worked well. He went to the gym 4 times weekly, since retirement in 03-2013 , but his wife illness now makes this impossible. He naps now  in daytime, power-naps in the afternoon,of 30 minutes or less, feeling refreshed after wards.  He has had no sinus surgery, no tonsillectomy and no septal correction. His son has sleep apnea and used CPAP.  Interval history : on 05-27-14 Tommy [name] underwent a split night polysomnography.  His AHI was 44.6 his RDI 52.9 REM sleep was not seen in the first 2 hours. Interestingly supine in his sleep apnea index was lower than and when he slept on the right. The patient also had 232 periodic limb movements with arousal and index of 9.8. Once on CPAP, he was titrated to 10 cm water pressure ,but he seemed to have done the best at 9 cm - his AHI was reduced significantly. Oxygen nadir was not 83% but still with  31 minutes of desaturation. His PLM arousal index was still 13.6 saw CPAP but not necessarily address the issue. The AHI at 9 cm water pressure was 0.0. Unable today to see a download for the last 67 days since initially starting on in the machine. The patient had been using CPAP in the past and prefers a full face mask. His average time date using the machine is 5 hours and 52 minutes, his EPI level is still a stat pressure is 9 he still has an AHI of 9.4. He attributes this to a rather high air leak. The residual AHI on 9 cm water pressure right now is not satisfied is not satisfying as a school to 10 per hour.  He is on air sense auto set machine to [facility] I would like to and a pressure window between 7 and 12 cm water and allowed the patient to use the mask he prefers, which is a full face mask. Again he is used to sleep on the side and he uses a special CPAP pillow, configured to allow the mask to stay in place  REVIEW OF SYSTEMS: Out of a complete 14 system review of symptoms, the patient complains only of the following symptoms, and all other reviewed systems are negative.  See history of present illness  ALLERGIES: No Known Allergies  HOME MEDICATIONS: Outpatient Prescriptions Prior to Visit  Medication Sig Dispense Refill  . amLODipine (NORVASC) 10 MG tablet Take 10 mg by mouth every morning.    Marland Kitchen aspirin 325 MG EC tablet Take 325 mg by mouth daily.    Marland Kitchen atorvastatin (LIPITOR) 20 MG tablet Take 20 mg by mouth every evening.    . benazepril (LOTENSIN) 40 MG tablet Take 40 mg by mouth every morning.    . fluticasone (FLONASE) 50 MCG/ACT nasal spray Place 2 sprays into the nose daily.    Marland Kitchen ofloxacin (FLOXIN) 0.3 % otic solution As needed    . oxyCODONE (ROXICODONE) 5 MG immediate release tablet Take 1-2 tablets (5-10 mg total) by mouth every 4 (four) hours as needed. 30 tablet 0  . rOPINIRole (REQUIP) 0.25 MG tablet Take 1 tablet (0.25 mg total) by mouth at bedtime. 30 tablet  5  . timolol (TIMOPTIC) 0.5 % ophthalmic solution 1 drop 2 (two) times daily. Left eye    . venlafaxine XR (EFFEXOR-XR) 75 MG 24 hr capsule Take 225 mg by mouth every morning.     No facility-administered medications prior to visit.   PHYSICAL EXAM  Filed Vitals:   01/21/16 0950  BP: 142/86  Pulse: 88  Resp: 20  Height: 5\' 6"  (1.676 m)  Weight: 252 lb (114.306 kg)   Body mass index is 40.69  kg/(m^2).  Generalized: Well developed, in no acute distress  Neck: Circumference 19 inches, Mallampati 4+- invisible uvula  Morbidly obese. Short of breath.  Click at the left TMJ, no retrognathia neck circumference 19 inches no delayed swallowing no palpable lymph nodes no goiter.  Pulmonary wheezing, sinus congestion, nasal discharge.   Neurological examination  Mentation: Alert oriented to time, place, history taking. Follows all commands speech and language fluent Cranial nerve - no change in taste or smell Pupils were equal round reactive to light. Extraocular movements were full, visual field were full on confrontational test. Facial sensation and strength were normal. Uvula tongue midline. Head turning and shoulder shrug  were normal and symmetric. Motor: The motor testing reveals symmetric motor tone. Strength was the left hand is stronger than with a right. The patient is right-hand dominant. Sensory: Sensory testing is intact to soft touch on all 4 extremities. Coordination: Cerebellar testing reveals good finger-nose-finger and heel-to-shin bilaterally.  Gait and station: Gait is intact  Reflexes: Deep tendon reflexes are symmetric and normal bilaterally.   DIAGNOSTIC DATA (LABS, IMAGING, TESTING) - I reviewed patient records, labs, notes, testing and imaging myself where available.  ASSESSMENT AND PLAN  This 25 minute Rv included more than 50% of face to face time and was dedicated to coordination of care , finding a new  DME. j he had problems with [employer] not supplying him with  his CPAP needs.   76 y.o. year old male  has a past medical history of Hypertension; Sleep apnea; Hyperlipemia; Seasonal allergies; Arthritis; OSA on CPAP; PLMD (periodic limb movement disorder) (09/02/2014); and Hypoxemia (09/02/2014). here with:  1. Obstructive sleep apnea on CPAP, compliance visit - resmed air 10. I cannot confirm the settings today but this machine is AutoSet compatible. Upper pressure 12 cm water . Interface is a nasal mask, not longer FFM.  He used the machine 95% of the last 19 days since his pressure settings were finally changed from 8-12 cm pressure window. Average user time is 5 hours 16 minutes. EPR level is 2 cm. Residual AHI was very high 18.3 and seems to be also related to high air leaks. Given the high residual AHI I will ask to have the maximum pressure further increased with the window from 8-15 cm water. He likes the nose interface better than the FFM.    At the patient's previous office visit it was requested that his settings be changed to AutoSet 8-12 cm of water. It does not appear that this has been completed. I will send another order to [facility] and have my assistant call so the changes can be made. Also advised patient to follow-up with his DME regarding his mask and straps. Patient verbalized understanding. He will follow-up in 3 months with NP or sooner if needed.  [employer] order to increase auto from 8 through 15 cm water.   Larey Seat, MD  01/21/2016, 10:09 AM [facility] [address], [place], [place] [address] [phone]

## 2016-01-21 NOTE — Patient Instructions (Signed)
We have asked your durable medical equipment company to adjust the higher pressure setting to 15 cm with the hope that this will reduce your apnea index. I'm glad you're doing better with a nasal CPAP which usually has lower air leaks as the area it covers smaller. He will follow up with acondition in 2-3 months to make sure that the setting suits you. From there on I will be happy to see you once a year. CD

## 2016-01-26 DIAGNOSIS — H401111 Primary open-angle glaucoma, right eye, mild stage: Secondary | ICD-10-CM | POA: Diagnosis not present

## 2016-01-26 DIAGNOSIS — H401123 Primary open-angle glaucoma, left eye, severe stage: Secondary | ICD-10-CM | POA: Diagnosis not present

## 2016-04-21 ENCOUNTER — Ambulatory Visit (INDEPENDENT_AMBULATORY_CARE_PROVIDER_SITE_OTHER): Payer: Medicare Other | Admitting: Adult Health

## 2016-04-21 ENCOUNTER — Telehealth: Payer: Self-pay | Admitting: *Deleted

## 2016-04-21 ENCOUNTER — Encounter: Payer: Self-pay | Admitting: Adult Health

## 2016-04-21 VITALS — BP 123/78 | HR 68 | Resp 16 | Ht 66.0 in | Wt 258.6 lb

## 2016-04-21 DIAGNOSIS — G4733 Obstructive sleep apnea (adult) (pediatric): Secondary | ICD-10-CM | POA: Diagnosis not present

## 2016-04-21 DIAGNOSIS — Z9989 Dependence on other enabling machines and devices: Principal | ICD-10-CM

## 2016-04-21 NOTE — Progress Notes (Signed)
I agree with the assessment and plan as directed by NP .The patient is known to me .   Kristyna Bradstreet, MD  

## 2016-04-21 NOTE — Telephone Encounter (Signed)
Fax confirmation received for DME  cpap mask.

## 2016-04-21 NOTE — Progress Notes (Signed)
PATIENT: Tommy [name] DOB: 09/23/1941  REASON FOR VISIT: follow up- obstructive sleep apnea on CPAP HISTORY FROM: patient  HISTORY OF PRESENT ILLNESS:  Tommy [name] is a 75 year old male with a history of obstructive sleep apnea on CPAP. He returns today for a compliance visit. His CPAP download indicates that he use his machine 30 out of 30 days for compliance of 100%. On average he uses machine greater than 4 hours 28 out of 30 days for compliance of 93%. On average he uses his machine 6 hours and 34 minutes. He is on a minimum pressure of 8 cm of water and maximum pressure of 15 cm of water with EPR of 2. His residual AHI is 7 he does have a significant leak in the 95th percentile at 29.9 L/m. The patient reports that he was sent a nasal mask --he does not feel that this works well for him. He would like to resume the full face mask. He reports that his Epworth sleepiness score is 8 and fatigue severity score is 21. He returns today for an evaluation.  HISTORY (DOHMEIER): 01-21-16; Tommy [name] is a meanwhile 75 year old male with a history of obstructive sleep apnea on CPAP. He returns today for a compliance visit . He lost his first wife after 69 years of marriage, has 4 sons. He remarried within a year, moved form his established Neighborhood out to [place]. His CPAP download shows that he uses his machine for 29 out of 30 days for compliance of 97%. He uses the machine greater than 4 hours 27 out of 30 days for compliance of 90%. On average he uses his machine 7 hours and 20 minutes. His AHI is 2.7 on 9 cm H20 of water with EPR of 2. The patient has a high air leak in the 95th percentile at 31.9L/min.  patient states that he does not like to sleep on his back and when he turns to his sides that when the mask becomes dislodged. The patient also feels that the straps may be causing a rash. He usually goes to bed around 10:30 PM and arises at 7:30 AM. His Epworth sleepiness score is 11 was  previously 13. Fatigue severity score is 22 was previously 14.    HISTORY 09/02/14 (CD): Tommy [name] is a married , caucasian 75 y.o. male , who is seen here as a referralfrom Dr. Dagmar Hait for a sleep apnea evaluation.  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 He was evaluated for sleep apnea at the age of 89 in December 2006. His sleep study at the [facility] documented an AHI of 58 the patient did not benefit from CPAP he used at home and returned for titration to [employer]- VPAP machine in January 2007.  VPAP machine was prescribed at the time with an EEP of 9 cm water and a pressure support setting between 3 and 10 cm water.  The patient states that he felt for many years well treated under these pressure settings. However his machine now is not longer functioning well he does note a supply him with the air he crates, it'll spontaneously switch off in the middle of nocturnal use. The download is not obtainable from the machine this age. The machine seems not longer to hold pressure. He was followed by "sleep med" but as he became an Medicare patient was referred for a new sleep apnea evaluation to document the current degree of apnea  and if present to be titrate as of now on needed pressures. His current sleep habits are as follows- he goes to bed at 11PM but needs about 20-30 minutes to initiate sleep , waking up frequently , unsure why he wakes . He sleeps for the longest uninterrupted time about 2 hours. He has 3 nocturia breaks on average. He will assist his wife with some water etc.  He sleeps only 4-5 hours at the most. He wakes up spontaneously, needs only 15 minutes to rise and get ready. No Alarm used. He drinks decaffeinated tea and coffee, cola with caffeine 3-4 a week.  His morning is characterized by a dry mouth, but no headaches.  He craves 6-7 hours of sleep as he used to have when his machine worked well. He went to  the gym 4 times weekly, since retirement in 03-2013 , but his wife illness now makes this impossible. He naps now in daytime, power-naps in the afternoon,of 30 minutes or less, feeling refreshed after wards.  He has had no sinus surgery, no tonsillectomy and no septal correction. His son has sleep apnea and used CPAP.  Interval history : on 05-27-14 Tommy [name] underwent a split night polysomnography.  His AHI was 44.6 his RDI 52.9 REM sleep was not seen in the first 2 hours. Interestingly supine in his sleep apnea index was lower than and when he slept on the right. The patient also had 232 periodic limb movements with arousal and index of 9.8. Once on CPAP, he was titrated to 10 cm water pressure ,but he seemed to have done the best at 9 cm - his AHI was reduced significantly. Oxygen nadir was not 83% but still with 31 minutes of desaturation. His PLM arousal index was still 13.6 saw CPAP but not necessarily address the issue. The AHI at 9 cm water pressure was 0.0. Unable today to see a download for the last 67 days since initially starting on in the machine. The patient had been using CPAP in the past and prefers a full face mask. His average time date using the machine is 5 hours and 52 minutes, his EPI level is still a stat pressure is 9 he still has an AHI of 9.4. He attributes this to a rather high air leak. The residual AHI on 9 cm water pressure right now is not satisfied is not satisfying as a school to 10 per hour.  He is on air sense auto set machine to [facility] I would like to and a pressure window between 7 and 12 cm water and allowed the patient to use the mask he prefers, which is a full face mask. Again he is used to sleep on the side and he uses a special CPAP pillow, configured to allow the mask to stay in place   REVIEW OF SYSTEMS: Out of a complete 14 system review of symptoms, the patient complains only of the following symptoms, and all other reviewed systems are  negative.  Ear discharge, eye itching, eye redness, cough, restless leg, joint pain, moles, dizziness  ALLERGIES: No Known Allergies  HOME MEDICATIONS: Outpatient Prescriptions Prior to Visit  Medication Sig Dispense Refill  . amLODipine (NORVASC) 10 MG tablet Take 10 mg by mouth every morning.    Marland Kitchen aspirin 325 MG EC tablet Take 325 mg by mouth daily.    Marland Kitchen atorvastatin (LIPITOR) 20 MG tablet Take 20 mg by mouth every evening.    . benazepril (LOTENSIN) 40 MG tablet  Take 40 mg by mouth every morning.    . fluticasone (FLONASE) 50 MCG/ACT nasal spray Place 2 sprays into the nose daily.    Marland Kitchen ofloxacin (FLOXIN) 0.3 % otic solution As needed    . oxyCODONE (ROXICODONE) 5 MG immediate release tablet Take 1-2 tablets (5-10 mg total) by mouth every 4 (four) hours as needed. 30 tablet 0  . rOPINIRole (REQUIP) 0.25 MG tablet Take 1 tablet (0.25 mg total) by mouth at bedtime. 30 tablet 5  . timolol (TIMOPTIC) 0.5 % ophthalmic solution 1 drop 2 (two) times daily. Left eye    . venlafaxine XR (EFFEXOR-XR) 75 MG 24 hr capsule Take 225 mg by mouth every morning.     No facility-administered medications prior to visit.    PAST MEDICAL HISTORY: Past Medical History  Diagnosis Date  . Hypertension   . Sleep apnea     uses a cpap  . Hyperlipemia   . Seasonal allergies   . Arthritis   . OSA on CPAP   . PLMD (periodic limb movement disorder) 09/02/2014  . Hypoxemia 09/02/2014    PAST SURGICAL HISTORY: Past Surgical History  Procedure Laterality Date  . Joint replacement      bilateral knee replacemts  . Joint replacement      bilateral shoulder replacemts  . Knee arthroscopy      right and left  . Total knee arthroplasty  2005    right  . Total knee arthroplasty  2004    left  . Total shoulder arthroplasty  2012    left  . Shoulder arthroscopy  2011    right  . Bunionectomy Right 01/03/2014    Procedure: RIGHT LAPIDUS BUNION CORRECTION;  Surgeon: Wylene Simmer, MD;  Location: [place];  Service: Orthopedics;  Laterality: Right;    FAMILY HISTORY: Family History  Problem Relation Age of Onset  . Glaucoma Father   . Alzheimer's disease Mother   . Dementia Mother   . Heart Problems Maternal Grandfather   . Heart Problems Maternal Grandmother     SOCIAL HISTORY: Social History   Social History  . Marital Status: Married    Spouse Name: Vaughan Basta  . Number of Children: 4  . Years of Education: 13   Occupational History  . Not on file.   Social History Main Topics  . Smoking status: Former Smoker    Quit date: 01/20/1983  . Smokeless tobacco: Never Used  . Alcohol Use: No  . Drug Use: No  . Sexual Activity: Not on file   Other Topics Concern  . Not on file   Social History Narrative   Patient lives at home alone and he is widowed.   Patient has four adult children.   Patient is retired.   Patient has a college education.   Patient is right-handed.   Patient drinks three-four sodas per week.      PHYSICAL EXAM  Filed Vitals:   04/21/16 0946  BP: 123/78  Pulse: 68  Resp: 16  Height: 5\' 6"  (1.676 m)  Weight: 258 lb 9.6 oz (117.3 kg)   Body mass index is 41.76 kg/(m^2).  Generalized: Well developed, in no acute distress  Neck: Circumference 19-1/2 inches, Mallampati 3+  Neurological examination  Mentation: Alert oriented to time, place, history taking. Follows all commands speech and language fluent Cranial nerve II-XII: Pupils were equal round reactive to light. Extraocular movements were full, visual field were full on confrontational test. Facial sensation and strength were  normal. Uvula tongue midline. Head turning and shoulder shrug  were normal and symmetric. Motor: The motor testing reveals 5 over 5 strength of all 4 extremities. Good symmetric motor tone is noted throughout.  Sensory: Sensory testing is intact to soft touch on all 4 extremities. No evidence of extinction is noted.  Coordination: Cerebellar testing reveals  good finger-nose-finger and heel-to-shin bilaterally.  Gait and station: Gait is normal.  Reflexes: Deep tendon reflexes are symmetric and normal bilaterally.   DIAGNOSTIC DATA (LABS, IMAGING, TESTING) - I reviewed patient records, labs, notes, testing and imaging myself where available.     ASSESSMENT AND PLAN 75 y.o. year old male  has a past medical history of Hypertension; Sleep apnea; Hyperlipemia; Seasonal allergies; Arthritis; OSA on CPAP; PLMD (periodic limb movement disorder) (09/02/2014); and Hypoxemia (09/02/2014). here with:  1. Obstructive sleep apnea on CPAP  The patient's compliance is excellent. His residual AHI is slightly elevated. However he was sent a nasal mask but would like to switch back to his full face mask. I will send it in order to his DME requesting this switch. Patient advised that if his symptoms worsen or he develops any new symptoms he should let us know. He will follow-up in 6 months or sooner if needed.     Ward Givens, MSN, NP-C 04/21/2016, 10:35 AM [facility] [address], [place], [place] [address] [phone]

## 2016-04-21 NOTE — Patient Instructions (Signed)
Continue using CPAP nighty New order sent for full face mask If your symptoms worsen or you develop new symptoms please let us know.

## 2016-04-26 ENCOUNTER — Telehealth: Payer: Self-pay | Admitting: *Deleted

## 2016-04-26 NOTE — Telephone Encounter (Signed)
I called and spoke to [place] at [phone].  They delivered face mask 04-22-16 for pt at the address listed.  I LMVM for pt to return call.

## 2016-04-26 NOTE — Telephone Encounter (Addendum)
Pt's wife, Carrol Pomplun,  called asking that an order in for a full face mask. Pt will be going out of town on Friday morning. Would like it before then. Please call

## 2016-04-27 NOTE — Telephone Encounter (Signed)
I called and spoke to Jenny Reichmann with [name].  They do have the order for full face mask.  They need to know from the delivery 04-22-16 if you have opened the plastic package.  I could not verify this so please call them back [phone] opt 3 Customer Service.  I did relay time sensitive.  (need prior to this Friday).  LMVM for pt regarding this.

## 2016-04-27 NOTE — Telephone Encounter (Signed)
I called son's # (not on DPR) but phone NW #.  I called and LMVM with same information left on home # about calling [employer].    Work # busy.

## 2016-04-27 NOTE — Telephone Encounter (Addendum)
Pt returned Sandy's call. Pt sts mask that was sent is the wrong one

## 2016-04-29 NOTE — Telephone Encounter (Signed)
I called pt and relayed per below.  He had not listened to his messages.   I gave him the # and he will call.  (he did not open plastic bag).

## 2016-05-06 NOTE — Telephone Encounter (Signed)
See other note

## 2016-06-25 DIAGNOSIS — H401123 Primary open-angle glaucoma, left eye, severe stage: Secondary | ICD-10-CM | POA: Diagnosis not present

## 2016-08-10 DIAGNOSIS — Z125 Encounter for screening for malignant neoplasm of prostate: Secondary | ICD-10-CM | POA: Diagnosis not present

## 2016-08-10 DIAGNOSIS — R8299 Other abnormal findings in urine: Secondary | ICD-10-CM | POA: Diagnosis not present

## 2016-08-10 DIAGNOSIS — E784 Other hyperlipidemia: Secondary | ICD-10-CM | POA: Diagnosis not present

## 2016-08-10 DIAGNOSIS — R7301 Impaired fasting glucose: Secondary | ICD-10-CM | POA: Diagnosis not present

## 2016-08-10 DIAGNOSIS — I10 Essential (primary) hypertension: Secondary | ICD-10-CM | POA: Diagnosis not present

## 2016-08-17 DIAGNOSIS — Z0000 Encounter for general adult medical examination without abnormal findings: Secondary | ICD-10-CM | POA: Diagnosis not present

## 2016-08-17 DIAGNOSIS — R7301 Impaired fasting glucose: Secondary | ICD-10-CM | POA: Diagnosis not present

## 2016-08-17 DIAGNOSIS — F39 Unspecified mood [affective] disorder: Secondary | ICD-10-CM | POA: Diagnosis not present

## 2016-08-17 DIAGNOSIS — Z1389 Encounter for screening for other disorder: Secondary | ICD-10-CM | POA: Diagnosis not present

## 2016-08-17 DIAGNOSIS — G4730 Sleep apnea, unspecified: Secondary | ICD-10-CM | POA: Diagnosis not present

## 2016-08-17 DIAGNOSIS — I129 Hypertensive chronic kidney disease with stage 1 through stage 4 chronic kidney disease, or unspecified chronic kidney disease: Secondary | ICD-10-CM | POA: Diagnosis not present

## 2016-08-17 DIAGNOSIS — M19049 Primary osteoarthritis, unspecified hand: Secondary | ICD-10-CM | POA: Diagnosis not present

## 2016-08-17 DIAGNOSIS — Z6841 Body Mass Index (BMI) 40.0 and over, adult: Secondary | ICD-10-CM | POA: Diagnosis not present

## 2016-08-17 DIAGNOSIS — Z1212 Encounter for screening for malignant neoplasm of rectum: Secondary | ICD-10-CM | POA: Diagnosis not present

## 2016-08-17 DIAGNOSIS — N529 Male erectile dysfunction, unspecified: Secondary | ICD-10-CM | POA: Diagnosis not present

## 2016-08-17 DIAGNOSIS — I10 Essential (primary) hypertension: Secondary | ICD-10-CM | POA: Diagnosis not present

## 2016-08-17 DIAGNOSIS — E784 Other hyperlipidemia: Secondary | ICD-10-CM | POA: Diagnosis not present

## 2016-09-15 DIAGNOSIS — Z23 Encounter for immunization: Secondary | ICD-10-CM | POA: Diagnosis not present

## 2016-09-27 DIAGNOSIS — H401123 Primary open-angle glaucoma, left eye, severe stage: Secondary | ICD-10-CM | POA: Diagnosis not present

## 2016-09-27 DIAGNOSIS — H401111 Primary open-angle glaucoma, right eye, mild stage: Secondary | ICD-10-CM | POA: Diagnosis not present

## 2016-09-27 DIAGNOSIS — H2513 Age-related nuclear cataract, bilateral: Secondary | ICD-10-CM | POA: Diagnosis not present

## 2016-09-29 DIAGNOSIS — L218 Other seborrheic dermatitis: Secondary | ICD-10-CM | POA: Diagnosis not present

## 2016-09-29 DIAGNOSIS — D225 Melanocytic nevi of trunk: Secondary | ICD-10-CM | POA: Diagnosis not present

## 2016-09-29 DIAGNOSIS — L918 Other hypertrophic disorders of the skin: Secondary | ICD-10-CM | POA: Diagnosis not present

## 2016-09-29 DIAGNOSIS — L821 Other seborrheic keratosis: Secondary | ICD-10-CM | POA: Diagnosis not present

## 2016-09-29 DIAGNOSIS — B078 Other viral warts: Secondary | ICD-10-CM | POA: Diagnosis not present

## 2016-10-25 ENCOUNTER — Encounter: Payer: Self-pay | Admitting: Adult Health

## 2016-10-25 ENCOUNTER — Ambulatory Visit (INDEPENDENT_AMBULATORY_CARE_PROVIDER_SITE_OTHER): Payer: Medicare Other | Admitting: Adult Health

## 2016-10-25 ENCOUNTER — Telehealth: Payer: Self-pay

## 2016-10-25 VITALS — BP 124/77 | HR 75 | Ht 66.0 in | Wt 263.0 lb

## 2016-10-25 DIAGNOSIS — G4733 Obstructive sleep apnea (adult) (pediatric): Secondary | ICD-10-CM | POA: Diagnosis not present

## 2016-10-25 DIAGNOSIS — Z9989 Dependence on other enabling machines and devices: Secondary | ICD-10-CM | POA: Diagnosis not present

## 2016-10-25 NOTE — Progress Notes (Signed)
PATIENT: Tommy [name] DOB: 27-May-1941  REASON FOR VISIT: follow up- obstructive sleep apnea on CPAP HISTORY FROM: patient  HISTORY OF PRESENT ILLNESS: Update 10/25/2016: [name] is a 75 year old male with a history of obstructive sleep apnea on CPAP. He returns today for compliance download. His download indicates that he uses his machine 30 out of 30 days for compliance of 100%. He used his machine greater than 4 hours 25 out of 30 days for compliance of the 83%. On average he uses his machine 6 hours and 48 minutes. His residual AHI is 6.8 on a minimum pressure of 8 cm of water and maximum pressure of 15 cm water with EPR 2. He does have a significant leak in the 95th percentile at 40.1 L/m. The patient does use a fullface mask. He states that his last face mask was a different size. He states that he can feel it leaking nightly. He returns today for an evaluation.  Update  04/21/16 (MM): [name] is a 75 year old male with a history of obstructive sleep apnea on CPAP. He returns today for a compliance visit. His CPAP download indicates that he use his machine 30 out of 30 days for compliance of 100%. On average he uses machine greater than 4 hours 28 out of 30 days for compliance of 93%. On average he uses his machine 6 hours and 34 minutes. He is on a minimum pressure of 8 cm of water and maximum pressure of 15 cm of water with EPR of 2. His residual AHI is 7 he does have a significant leak in the 95th percentile at 29.9 L/m. The patient reports that he was sent a nasal mask --he does not feel that this works well for him. He would like to resume the full face mask. He reports that his Epworth sleepiness score is 8 and fatigue severity score is 21. He returns today for an evaluation.  Update [facility]): 01-21-16; [name] is a meanwhile 75 year old male with a history of obstructive sleep apnea on CPAP. He returns today for a compliance visit . He lost his first wife after 31 years of  marriage, has 4 sons. He remarried within a year, moved form his established Neighborhood out to [place]. His CPAP download shows that he uses his machine for 29 out of 30 days for compliance of 97%. He uses the machine greater than 4 hours 27 out of 30 days for compliance of 90%. On average he uses his machine 7 hours and 20 minutes. His AHI is 2.7 on 9 cm H20 of water with EPR of 2. The patient has a high air leak in the 95th percentile at 31.9L/min.  patient states that he does not like to sleep on his back and when he turns to his sides that when the mask becomes dislodged. The patient also feels that the straps may be causing a rash. He usually goes to bed around 10:30 PM and arises at 7:30 AM. His Epworth sleepiness score is 11 was previously 13. Fatigue severity score is 22 was previously 14.    HISTORY 09/02/14 (CD): SAW PERELLI is a married , caucasian 75 y.o. male , who is seen here as a referralfrom Dr. Dagmar Hait for a sleep apnea evaluation.  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 He was evaluated for sleep apnea at the age of 34 in December 2006. His sleep study at the [facility] documented an  AHI of 58 the patient did not benefit from CPAP he used at home and returned for titration to [employer]- VPAP machine in January 2007.  VPAP machine was prescribed at the time with an EEP of 9 cm water and a pressure support setting between 3 and 10 cm water.  The patient states that he felt for many years well treated under these pressure settings. However his machine now is not longer functioning well he does note a supply him with the air he crates, it'll spontaneously switch off in the middle of nocturnal use. The download is not obtainable from the machine this age. The machine seems not longer to hold pressure. He was followed by "sleep med" but as he became an Medicare patient was referred for a new sleep apnea evaluation to  document the current degree of apnea and if present to be titrate as of now on needed pressures. His current sleep habits are as follows- he goes to bed at 11PM but needs about 20-30 minutes to initiate sleep , waking up frequently , unsure why he wakes . He sleeps for the longest uninterrupted time about 2 hours. He has 3 nocturia breaks on average. He will assist his wife with some water etc.  He sleeps only 4-5 hours at the most. He wakes up spontaneously, needs only 15 minutes to rise and get ready. No Alarm used. He drinks decaffeinated tea and coffee, cola with caffeine 3-4 a week.  His morning is characterized by a dry mouth, but no headaches.  He craves 6-7 hours of sleep as he used to have when his machine worked well. He went to the gym 4 times weekly, since retirement in 03-2013 , but his wife illness now makes this impossible. He naps now in daytime, power-naps in the afternoon,of 30 minutes or less, feeling refreshed after wards.  He has had no sinus surgery, no tonsillectomy and no septal correction. His son has sleep apnea and used CPAP.    REVIEW OF SYSTEMS: Out of a complete 14 system review of symptoms, the patient complains only of the following symptoms, and all other reviewed systems are negative.  Eye redness, joint pain, back pain, ringing in ears  ALLERGIES: No Known Allergies  HOME MEDICATIONS: Outpatient Medications Prior to Visit  Medication Sig Dispense Refill  . amLODipine (NORVASC) 10 MG tablet Take 10 mg by mouth every morning.    Marland Kitchen aspirin 325 MG EC tablet Take 325 mg by mouth daily.    Marland Kitchen atorvastatin (LIPITOR) 20 MG tablet Take 20 mg by mouth every evening.    . benazepril (LOTENSIN) 40 MG tablet Take 40 mg by mouth every morning.    . fluticasone (FLONASE) 50 MCG/ACT nasal spray Place 2 sprays into the nose daily.    Marland Kitchen ofloxacin (FLOXIN) 0.3 % otic solution As needed    . timolol (TIMOPTIC) 0.5 % ophthalmic solution 1 drop 2 (two) times daily. Left  eye    . venlafaxine XR (EFFEXOR-XR) 75 MG 24 hr capsule Take 225 mg by mouth every morning.    Marland Kitchen oxyCODONE (ROXICODONE) 5 MG immediate release tablet Take 1-2 tablets (5-10 mg total) by mouth every 4 (four) hours as needed. 30 tablet 0  . rOPINIRole (REQUIP) 0.25 MG tablet Take 1 tablet (0.25 mg total) by mouth at bedtime. 30 tablet 5   No facility-administered medications prior to visit.     PAST MEDICAL HISTORY: Past Medical History:  Diagnosis Date  . Arthritis   . Hyperlipemia   .  Hypertension   . Hypoxemia 09/02/2014  . OSA on CPAP   . PLMD (periodic limb movement disorder) 09/02/2014  . Seasonal allergies   . Sleep apnea    uses a cpap    PAST SURGICAL HISTORY: Past Surgical History:  Procedure Laterality Date  . BUNIONECTOMY Right 01/03/2014   Procedure: RIGHT LAPIDUS BUNION CORRECTION;  Surgeon: Wylene Simmer, MD;  Location: [place];  Service: Orthopedics;  Laterality: Right;  . JOINT REPLACEMENT     bilateral knee replacemts  . JOINT REPLACEMENT     bilateral shoulder replacemts  . KNEE ARTHROSCOPY     right and left  . SHOULDER ARTHROSCOPY  2011   right  . TOTAL KNEE ARTHROPLASTY  2005   right  . TOTAL KNEE ARTHROPLASTY  2004   left  . TOTAL SHOULDER ARTHROPLASTY  2012   left    FAMILY HISTORY: Family History  Problem Relation Age of Onset  . Glaucoma Father   . Alzheimer's disease Mother   . Dementia Mother   . Heart Problems Maternal Grandfather   . Heart Problems Maternal Grandmother     SOCIAL HISTORY: Social History   Social History  . Marital status: Married    Spouse name: Vaughan Basta  . Number of children: 4  . Years of education: 33   Occupational History  . Not on file.   Social History Main Topics  . Smoking status: Former Smoker    Quit date: 01/20/1983  . Smokeless tobacco: Never Used  . Alcohol use No  . Drug use: No  . Sexual activity: Not on file   Other Topics Concern  . Not on file   Social History Narrative    Patient lives at home alone and he is widowed.   Patient has four adult children.   Patient is retired.   Patient has a college education.   Patient is right-handed.   Patient drinks three-four sodas per week.      PHYSICAL EXAM  Vitals:   10/25/16 1000  BP: 124/77  Pulse: 75  Weight: 263 lb (119.3 kg)  Height: 5\' 6"  (1.676 m)   Body mass index is 42.45 kg/m.  Generalized: Well developed, in no acute distress   Neurological examination  Mentation: Alert oriented to time, place, history taking. Follows all commands speech and language fluent Cranial nerve II-XII: Pupils were equal round reactive to light. Extraocular movements were full, visual field were full on confrontational test. Facial sensation and strength were normal. Uvula tongue midline. Head turning and shoulder shrug  were normal and symmetric. Motor: The motor testing reveals 5 over 5 strength of all 4 extremities. Good symmetric motor tone is noted throughout.  Sensory: Sensory testing is intact to soft touch on all 4 extremities. No evidence of extinction is noted.  Coordination: Cerebellar testing reveals good finger-nose-finger and heel-to-shin bilaterally.  Gait and station: Gait is normal. Tandem gait is normal. Romberg is negative. No drift is seen.  Reflexes: Deep tendon reflexes are symmetric and normal bilaterally.   DIAGNOSTIC DATA (LABS, IMAGING, TESTING) - I reviewed patient records, labs, notes, testing and imaging myself where available.       ASSESSMENT AND PLAN 75 y.o. year old male  has a past medical history of Arthritis; Hyperlipemia; Hypertension; Hypoxemia (09/02/2014); OSA on CPAP; PLMD (periodic limb movement disorder) (09/02/2014); Seasonal allergies; and Sleep apnea. here with:  1. Obstructive sleep apnea on CPAP  The patient's download shows excellent compliance. He does have a significant  leak. I will send him to the sleep lab to have his mask refitted today. Patient is amenable  to this plan. He will follow-up in 6 months or sooner if needed.     Ward Givens, MSN, NP-C 10/25/2016, 10:07 AM [facility] [address] [place], [place] [address] [phone]

## 2016-10-25 NOTE — Telephone Encounter (Signed)
Patient came by sleep lab for a mask fitting. His current mask Airfit F20 has a 40 leak from download. I fitted him with F&P simplus medium. Hooked him up to cpap 10 and had a 0 leak. Gave him one to take home and try.

## 2016-10-25 NOTE — Patient Instructions (Signed)
Continue CPAP nightly  Mask refitted today

## 2016-10-25 NOTE — Progress Notes (Signed)
I agree with the assessment and plan as directed by NP .The patient is known to me .   Jolleen Seman, MD  

## 2017-02-03 DIAGNOSIS — G4730 Sleep apnea, unspecified: Secondary | ICD-10-CM | POA: Diagnosis not present

## 2017-02-03 DIAGNOSIS — H6123 Impacted cerumen, bilateral: Secondary | ICD-10-CM | POA: Diagnosis not present

## 2017-02-03 DIAGNOSIS — I129 Hypertensive chronic kidney disease with stage 1 through stage 4 chronic kidney disease, or unspecified chronic kidney disease: Secondary | ICD-10-CM | POA: Diagnosis not present

## 2017-02-03 DIAGNOSIS — R7301 Impaired fasting glucose: Secondary | ICD-10-CM | POA: Diagnosis not present

## 2017-02-03 DIAGNOSIS — F39 Unspecified mood [affective] disorder: Secondary | ICD-10-CM | POA: Diagnosis not present

## 2017-02-03 DIAGNOSIS — Z6841 Body Mass Index (BMI) 40.0 and over, adult: Secondary | ICD-10-CM | POA: Diagnosis not present

## 2017-02-03 DIAGNOSIS — D229 Melanocytic nevi, unspecified: Secondary | ICD-10-CM | POA: Diagnosis not present

## 2017-03-08 DIAGNOSIS — H9193 Unspecified hearing loss, bilateral: Secondary | ICD-10-CM | POA: Diagnosis not present

## 2017-03-08 DIAGNOSIS — H6123 Impacted cerumen, bilateral: Secondary | ICD-10-CM | POA: Diagnosis not present

## 2017-03-28 DIAGNOSIS — H401111 Primary open-angle glaucoma, right eye, mild stage: Secondary | ICD-10-CM | POA: Diagnosis not present

## 2017-03-28 DIAGNOSIS — H401123 Primary open-angle glaucoma, left eye, severe stage: Secondary | ICD-10-CM | POA: Diagnosis not present

## 2017-04-01 DIAGNOSIS — Z87891 Personal history of nicotine dependence: Secondary | ICD-10-CM | POA: Diagnosis not present

## 2017-04-01 DIAGNOSIS — H6123 Impacted cerumen, bilateral: Secondary | ICD-10-CM | POA: Diagnosis not present

## 2017-04-01 DIAGNOSIS — H93293 Other abnormal auditory perceptions, bilateral: Secondary | ICD-10-CM | POA: Diagnosis not present

## 2017-04-25 DIAGNOSIS — H401111 Primary open-angle glaucoma, right eye, mild stage: Secondary | ICD-10-CM | POA: Diagnosis not present

## 2017-04-25 DIAGNOSIS — H401123 Primary open-angle glaucoma, left eye, severe stage: Secondary | ICD-10-CM | POA: Diagnosis not present

## 2017-04-26 ENCOUNTER — Ambulatory Visit (INDEPENDENT_AMBULATORY_CARE_PROVIDER_SITE_OTHER): Payer: Medicare Other | Admitting: Adult Health

## 2017-04-26 ENCOUNTER — Encounter: Payer: Self-pay | Admitting: Adult Health

## 2017-04-26 ENCOUNTER — Encounter (INDEPENDENT_AMBULATORY_CARE_PROVIDER_SITE_OTHER): Payer: Self-pay

## 2017-04-26 VITALS — BP 128/71 | HR 83 | Ht 66.0 in | Wt 261.0 lb

## 2017-04-26 DIAGNOSIS — Z9989 Dependence on other enabling machines and devices: Secondary | ICD-10-CM | POA: Diagnosis not present

## 2017-04-26 DIAGNOSIS — G4733 Obstructive sleep apnea (adult) (pediatric): Secondary | ICD-10-CM | POA: Diagnosis not present

## 2017-04-26 NOTE — Patient Instructions (Signed)
Make sure straps are tighten  Continue using cpap nightly >4 hours each night If your symptoms worsen or you develop new symptoms please let us know.

## 2017-04-26 NOTE — Progress Notes (Signed)
PATIENT: Tommy [name] DOB: [birth year]  REASON FOR VISIT: follow up-obstructive sleep apnea on CPAP HISTORY FROM: patient  HISTORY OF PRESENT ILLNESS: [name] is a 76 year old male with a history of obstructive sleep apnea on CPAP. He returns today for a compliance download. His download indicates that he uses machine every night for compliance of 100%. He uses machine greater than 4 hours 17 out of 30 days for compliance of 57%. On average he uses his machine 4 hours and 26 minutes. He is on a minimum pressure of 8 cm water and maximum pressure of 15 cm water. His residual AHI is 8.7 he does have a leak in the 95th percentile at 56.6 L/m. He states he was fitted with a new mask at the last visit and does feel that it works better for him. He states that his mask typically leaks when he lays on his sides. His Epworth sleepiness score is 13 and fatigue severity score is 20. He returns today for an evaluation.  HISTORY11/13/2017: [name] is a 76 year old male with a history of obstructive sleep apnea on CPAP. He returns today for compliance download. His download indicates that he uses his machine 30 out of 30 days for compliance of 100%. He used his machine greater than 4 hours 25 out of 30 days for compliance of the 83%. On average he uses his machine 6 hours and 48 minutes. His residual AHI is 6.8 on a minimum pressure of 8 cm of water and maximum pressure of 15 cm water with EPR 2. He does have a significant leak in the 95th percentile at 40.1 L/m. The patient does use a fullface mask. He states that his last face mask was a different size. He states that he can feel it leaking nightly. He returns today for an evaluation.  Update  04/21/16 (MM): [name] is a 76 year old male with a history of obstructive sleep apnea on CPAP. He returns today for a compliance visit. His CPAP download indicates that he use his machine 30 out of 30 days for compliance of 100%. On average he uses machine  greater than 4 hours 28 out of 30 days for compliance of 93%. On average he uses his machine 6 hours and 34 minutes. He is on a minimum pressure of 8 cm of water and maximum pressure of 15 cm of water with EPR of 2. His residual AHI is 7 he does have a significant leak in the 95th percentile at 29.9 L/m. The patient reports that he was sent a nasal mask --he does not feel that this works well for him. He would like to resume the full face mask. He reports that his Epworth sleepiness score is 8 and fatigue severity score is 21. He returns today for an evaluation.  Update [facility]): 01-21-16; [name] is a meanwhile 76 year old male with a history of obstructive sleep apnea on CPAP. He returns today for a compliance visit . He lost his first wife after 74 years of marriage, has 4 sons. He remarried within a year, moved form his established Neighborhood out to [place]. His CPAP download shows that he uses his machine for 29 out of 30 days for compliance of 97%. He uses the machine greater than 4 hours 27 out of 30 days for compliance of 90%. On average he uses his machine 7 hours and 20 minutes. His AHI is 2.7 on 9 cm H20 of water with EPR of 2. The patient has a high  air leak in the 95th percentile at 31.9L/min.  patient states that he does not like to sleep on his back and when he turns to his sides that when the mask becomes dislodged. The patient also feels that the straps may be causing a rash. He usually goes to bed around 10:30 PM and arises at 7:30 AM. His Epworth sleepiness score is 11 was previously 13. Fatigue severity score is 22 was previously 14.    HISTORY 09/02/14 (CD): [name] is a married , caucasian 76 y.o. male , who is seen here as a referralfrom Dr. Dagmar Hait for a sleep apnea evaluation.  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 He was evaluated for sleep apnea at the age of 34 in December 2006. His sleep study at the [facility] documented an AHI of 58 the patient did not benefit from CPAP he used at home and returned for titration to [employer]- VPAP machine in January 2007.  VPAP machine was prescribed at the time with an EEP of 9 cm water and a pressure support setting between 3 and 10 cm water.  The patient states that he felt for many years well treated under these pressure settings. However his machine now is not longer functioning well he does note a supply him with the air he crates, it'll spontaneously switch off in the middle of nocturnal use. The download is not obtainable from the machine this age. The machine seems not longer to hold pressure. He was followed by "sleep med" but as he became an Medicare patient was referred for a new sleep apnea evaluation to document the current degree of apnea and if present to be titrate as of now on needed pressures. His current sleep habits are as follows- he goes to bed at 11PM but needs about 20-30 minutes to initiate sleep , waking up frequently , unsure why he wakes . He sleeps for the longest uninterrupted time about 2 hours. He has 3 nocturia breaks on average. He will assist his wife with some water etc.  He sleeps only 4-5 hours at the most. He wakes up spontaneously, needs only 15 minutes to rise and get ready. No Alarm used. He drinks decaffeinated tea and coffee, cola with caffeine 3-4 a week.  His morning is characterized by a dry mouth, but no headaches.  He craves 6-7 hours of sleep as he used to have when his machine worked well. He went to the gym 4 times weekly, since retirement in 03-2013 , but his wife illness now makes this impossible. He naps now in daytime, power-naps in the afternoon,of 30 minutes or less, feeling refreshed after wards.  He has had no sinus surgery, no tonsillectomy and no septal correction. His son has sleep apnea and used CPAP.     REVIEW OF SYSTEMS: Out of a complete 14 system review of symptoms, the  patient complains only of the following symptoms, and all other reviewed systems are negative.  Apnea, joint pain  ALLERGIES: Allergies  Allergen Reactions  . Dorzolamide Hcl-Timolol Mal Pf Other (See Comments)    dermatitis    HOME MEDICATIONS: Outpatient Medications Prior to Visit  Medication Sig Dispense Refill  . amLODipine (NORVASC) 10 MG tablet Take 10 mg by mouth every morning.    Marland Kitchen aspirin 325 MG EC tablet Take 325 mg by mouth daily.    Marland Kitchen atorvastatin (LIPITOR) 20 MG tablet Take 20 mg by mouth every  evening.    . benazepril (LOTENSIN) 40 MG tablet Take 40 mg by mouth every morning.    . fluticasone (FLONASE) 50 MCG/ACT nasal spray Place 2 sprays into the nose daily.    Marland Kitchen ofloxacin (FLOXIN) 0.3 % otic solution As needed    . timolol (TIMOPTIC) 0.5 % ophthalmic solution 1 drop 2 (two) times daily. Left eye    . venlafaxine XR (EFFEXOR-XR) 75 MG 24 hr capsule Take 225 mg by mouth every morning.     No facility-administered medications prior to visit.     PAST MEDICAL HISTORY: Past Medical History:  Diagnosis Date  . Arthritis   . Hyperlipemia   . Hypertension   . Hypoxemia 09/02/2014  . OSA on CPAP   . PLMD (periodic limb movement disorder) 09/02/2014  . Seasonal allergies   . Sleep apnea    uses a cpap    PAST SURGICAL HISTORY: Past Surgical History:  Procedure Laterality Date  . BUNIONECTOMY Right 01/03/2014   Procedure: RIGHT LAPIDUS BUNION CORRECTION;  Surgeon: Wylene Simmer, MD;  Location: [facility];  Service: Orthopedics;  Laterality: Right;  . JOINT REPLACEMENT     bilateral knee replacemts  . JOINT REPLACEMENT     bilateral shoulder replacemts  . KNEE ARTHROSCOPY     right and left  . SHOULDER ARTHROSCOPY  2011   right  . TOTAL KNEE ARTHROPLASTY  2005   right  . TOTAL KNEE ARTHROPLASTY  2004   left  . TOTAL SHOULDER ARTHROPLASTY  2012   left    FAMILY HISTORY: Family History  Problem Relation Age of Onset  . Glaucoma Father   .  Alzheimer's disease Mother   . Dementia Mother   . Heart Problems Maternal Grandfather   . Heart Problems Maternal Grandmother     SOCIAL HISTORY: Social History   Social History  . Marital status: Married    Spouse name: Vaughan Basta  . Number of children: 4  . Years of education: 5   Occupational History  . Not on file.   Social History Main Topics  . Smoking status: Former Smoker    Quit date: 01/20/1983  . Smokeless tobacco: Never Used  . Alcohol use No  . Drug use: No  . Sexual activity: Not on file   Other Topics Concern  . Not on file   Social History Narrative   Patient lives at home alone and he is widowed.   Patient has four adult children.   Patient is retired.   Patient has a college education.   Patient is right-handed.   Patient drinks three-four sodas per week.      PHYSICAL EXAM  Vitals:   04/26/17 0913  BP: 128/71  Pulse: 83  Weight: 261 lb (118.4 kg)  Height: 5\' 6"  (1.676 m)   Body mass index is 42.13 kg/m.  Generalized: Well developed, in no acute distress   Neurological examination  Mentation: Alert oriented to time, place, history taking. Follows all commands speech and language fluent Cranial nerve II-XII: Pupils were equal round reactive to light. Extraocular movements were full, visual field were full on confrontational test. Facial sensation and strength were normal. Uvula tongue midline. Head turning and shoulder shrug  were normal and symmetric. Neck circumference 18-1/2 inches Motor: The motor testing reveals 5 over 5 strength of all 4 extremities. Good symmetric motor tone is noted throughout.  Sensory: Sensory testing is intact to soft touch on all 4 extremities. No evidence of extinction is  noted.  Coordination: Cerebellar testing reveals good finger-nose-finger and heel-to-shin bilaterally.  Gait and station: Gait is normal.    DIAGNOSTIC DATA (LABS, IMAGING, TESTING) - I reviewed patient records, labs, notes, testing and imaging  myself where available.  Lab Results  Component Value Date   WBC 6.8 10/04/2011   HGB 15.8 01/03/2014   HCT 47.0 10/04/2011   MCV 87.2 10/04/2011   PLT 211 10/04/2011      Component Value Date/Time   NA 144 01/01/2014 1457   K 3.7 01/01/2014 1457   CL 102 01/01/2014 1457   CO2 29 01/01/2014 1457   GLUCOSE 105 (H) 01/01/2014 1457   BUN 17 01/01/2014 1457   CREATININE 1.21 01/01/2014 1457   CALCIUM 9.4 01/01/2014 1457   PROT 7.5 10/04/2011 0957   ALBUMIN 4.0 10/04/2011 0957   AST 22 10/04/2011 0957   ALT 21 10/04/2011 0957   ALKPHOS 67 10/04/2011 0957   BILITOT 0.4 10/04/2011 0957   GFRNONAA 59 (L) 01/01/2014 1457   GFRAA 68 (L) 01/01/2014 1457     ASSESSMENT AND PLAN 76 y.o. year old male  has a past medical history of Arthritis; Hyperlipemia; Hypertension; Hypoxemia (09/02/2014); OSA on CPAP; PLMD (periodic limb movement disorder) (09/02/2014); Seasonal allergies; and Sleep apnea. here with:  1. Obstructive sleep apnea on CPAP  Overall the patient is doing well. He is encouraged to continue using his CPAP nightly and for greater than 4 hours each night. He does have a significant leak with this mask. I've encouraged him to tighten his straps when he lays on his sides. He voiced understanding. He will follow-up in 6 months or sooner if needed.  I spent 15 minutes with the patient 50% of this time was spent reviewing his CPAP download.  Ward Givens, MSN, NP-C 04/26/2017, 9:31 AM [facility] [address] [place], [place] [address] [phone]

## 2017-04-27 NOTE — Progress Notes (Signed)
I agree with the assessment and plan as directed by NP .The patient is known to me .   Edwar Coe, MD  

## 2017-08-01 DIAGNOSIS — H401123 Primary open-angle glaucoma, left eye, severe stage: Secondary | ICD-10-CM | POA: Diagnosis not present

## 2017-08-01 DIAGNOSIS — H401111 Primary open-angle glaucoma, right eye, mild stage: Secondary | ICD-10-CM | POA: Diagnosis not present

## 2017-09-08 DIAGNOSIS — E784 Other hyperlipidemia: Secondary | ICD-10-CM | POA: Diagnosis not present

## 2017-09-08 DIAGNOSIS — I10 Essential (primary) hypertension: Secondary | ICD-10-CM | POA: Diagnosis not present

## 2017-09-08 DIAGNOSIS — N390 Urinary tract infection, site not specified: Secondary | ICD-10-CM | POA: Diagnosis not present

## 2017-09-08 DIAGNOSIS — R7301 Impaired fasting glucose: Secondary | ICD-10-CM | POA: Diagnosis not present

## 2017-09-08 DIAGNOSIS — Z125 Encounter for screening for malignant neoplasm of prostate: Secondary | ICD-10-CM | POA: Diagnosis not present

## 2017-09-12 DIAGNOSIS — H401111 Primary open-angle glaucoma, right eye, mild stage: Secondary | ICD-10-CM | POA: Diagnosis not present

## 2017-09-12 DIAGNOSIS — H401123 Primary open-angle glaucoma, left eye, severe stage: Secondary | ICD-10-CM | POA: Diagnosis not present

## 2017-09-15 DIAGNOSIS — Z6841 Body Mass Index (BMI) 40.0 and over, adult: Secondary | ICD-10-CM | POA: Diagnosis not present

## 2017-09-15 DIAGNOSIS — M19049 Primary osteoarthritis, unspecified hand: Secondary | ICD-10-CM | POA: Diagnosis not present

## 2017-09-15 DIAGNOSIS — Z23 Encounter for immunization: Secondary | ICD-10-CM | POA: Diagnosis not present

## 2017-09-15 DIAGNOSIS — E7849 Other hyperlipidemia: Secondary | ICD-10-CM | POA: Diagnosis not present

## 2017-09-15 DIAGNOSIS — Z0000 Encounter for general adult medical examination without abnormal findings: Secondary | ICD-10-CM | POA: Diagnosis not present

## 2017-09-15 DIAGNOSIS — R7301 Impaired fasting glucose: Secondary | ICD-10-CM | POA: Diagnosis not present

## 2017-09-15 DIAGNOSIS — Z1389 Encounter for screening for other disorder: Secondary | ICD-10-CM | POA: Diagnosis not present

## 2017-09-15 DIAGNOSIS — G4730 Sleep apnea, unspecified: Secondary | ICD-10-CM | POA: Diagnosis not present

## 2017-09-15 DIAGNOSIS — I129 Hypertensive chronic kidney disease with stage 1 through stage 4 chronic kidney disease, or unspecified chronic kidney disease: Secondary | ICD-10-CM | POA: Diagnosis not present

## 2017-09-15 DIAGNOSIS — H6123 Impacted cerumen, bilateral: Secondary | ICD-10-CM | POA: Diagnosis not present

## 2017-09-15 DIAGNOSIS — F3489 Other specified persistent mood disorders: Secondary | ICD-10-CM | POA: Diagnosis not present

## 2017-09-23 DIAGNOSIS — Z1212 Encounter for screening for malignant neoplasm of rectum: Secondary | ICD-10-CM | POA: Diagnosis not present

## 2017-09-30 DIAGNOSIS — H401123 Primary open-angle glaucoma, left eye, severe stage: Secondary | ICD-10-CM | POA: Diagnosis not present

## 2017-10-13 DIAGNOSIS — Z6841 Body Mass Index (BMI) 40.0 and over, adult: Secondary | ICD-10-CM | POA: Diagnosis not present

## 2017-10-13 DIAGNOSIS — I129 Hypertensive chronic kidney disease with stage 1 through stage 4 chronic kidney disease, or unspecified chronic kidney disease: Secondary | ICD-10-CM | POA: Diagnosis not present

## 2017-10-17 DIAGNOSIS — H401111 Primary open-angle glaucoma, right eye, mild stage: Secondary | ICD-10-CM | POA: Diagnosis not present

## 2017-10-17 DIAGNOSIS — H401123 Primary open-angle glaucoma, left eye, severe stage: Secondary | ICD-10-CM | POA: Diagnosis not present

## 2017-10-25 ENCOUNTER — Encounter: Payer: Self-pay | Admitting: Neurology

## 2017-10-31 ENCOUNTER — Other Ambulatory Visit: Payer: Self-pay | Admitting: Neurology

## 2017-10-31 ENCOUNTER — Ambulatory Visit (INDEPENDENT_AMBULATORY_CARE_PROVIDER_SITE_OTHER): Payer: Medicare Other | Admitting: Neurology

## 2017-10-31 ENCOUNTER — Encounter: Payer: Self-pay | Admitting: Neurology

## 2017-10-31 VITALS — BP 154/100 | HR 86

## 2017-10-31 DIAGNOSIS — Z9989 Dependence on other enabling machines and devices: Secondary | ICD-10-CM | POA: Diagnosis not present

## 2017-10-31 DIAGNOSIS — G4733 Obstructive sleep apnea (adult) (pediatric): Secondary | ICD-10-CM

## 2017-10-31 DIAGNOSIS — G4761 Periodic limb movement disorder: Secondary | ICD-10-CM | POA: Diagnosis not present

## 2017-10-31 DIAGNOSIS — R0902 Hypoxemia: Secondary | ICD-10-CM

## 2017-10-31 DIAGNOSIS — I10 Essential (primary) hypertension: Secondary | ICD-10-CM | POA: Diagnosis not present

## 2017-10-31 NOTE — Progress Notes (Signed)
PATIENT: Tommy [name] DOB: Jun 27, 1941  REASON FOR VISIT: follow up- obstructive sleep apnea on CPAP HISTORY FROM: patient  HISTORY OF PRESENT ILLNESS: Interval history from  11-19- 2018.  I have the pleasure of seeing Tommy [name] today in person, he had the last 3 visits in our office with nurse practitioner Vaughan Browner.  Tommy [name] has been a compliant CPAP user,  100% for days of the last 30 days, 97% for over 4 hours compliance with an average  use time of 6 hours and 36 minutes.   The pressure window is between 8 and 15 cm of water with 2 cm expiratory pressure relief.  He is using a full facemask. which is 95th percentile pressure is 13.7, his AHI is 9.9 which is too high.  This seems to be related to air leaks.  He has very high air leaks in the 95th percentile of 33 l /min.  Today's visit is meant to establish that the patient is eligible for a new CPAP machine, as well as for new interface and head gear.  Tommy [name] is DME- his medicare record indicates the machine is 76 years old.     01-21-16; [name] is a meanwhile 76 year old male with a history of obstructive sleep apnea on CPAP. He returns today for a compliance visit . He lost his first wife after 63 years of marriage, has 4 sons. He remarried within a year, moved form his established Neighborhood out to [place].  His CPAP download shows that he uses his machine for 29 out of 30 days for compliance of 97%. He uses the machine greater than 4 hours 27 out of 30 days for compliance of 90%. On average he uses his machine 7 hours and 20 minutes. His AHI is 2.7 on 9 cm H20 of water with EPR of 2. The patient has a high air  leak in the 95th percentile at 31.9L/min.  patient states that he does not like to sleep on his back and when he turns to his sides that when the mask becomes dislodged. The patient also feels that the straps may be causing a rash. He usually goes to bed around 10:30 PM and arises at 7:30 AM. His Epworth  sleepiness score is 11 was previously 13. Fatigue severity score is 22 was previously 14.     HISTORY 09/02/14 (Tommy): Tommy [name] is a married , caucasian 76 y.o. male , who is seen here as a referralfrom Dr. Dagmar Hait for a sleep apnea evaluation.  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 He was evaluated for sleep apnea at the age of 44 in December 2006. His sleep study at the [facility] documented an AHI of 58 the patient did not benefit from CPAP he used at home and returned for titration to [employer]- VPAP machine in January 2007.  VPAP machine was prescribed at the time with an EEP of 9 cm water and a pressure support setting between 3 and 10 cm water.  The patient states that he felt for many years well treated under these pressure settings. However his machine now is not longer functioning well he does note a supply him with the air he crates, it'll spontaneously switch off in the middle of nocturnal use. The download is not obtainable from the machine this age. The machine seems not longer to hold pressure. He was followed by  "sleep med" but as he became an  Medicare patient was referred for a new sleep apnea evaluation to document the current degree of apnea and if present to be titrate as of now on needed pressures. His current sleep habits are as follows- he goes to bed at 11PM but needs about 20-30 minutes to initiate sleep , waking up frequently , unsure why he wakes . He sleeps for the longest uninterrupted time about 2 hours. He has 3 nocturia breaks on average. He will assist his wife with some water etc.  He sleeps only 4-5 hours at the most. He wakes up spontaneously, needs only 15 minutes to rise and get ready. No Alarm used. He drinks decaffeinated tea and coffee, cola with caffeine 3-4 a week.  His morning is characterized by a dry mouth, but no headaches.  He craves 6-7 hours of sleep as he used to have when his  machine worked well. He went to the gym 4 times weekly, since retirement in 03-2013 , but his wife illness now makes this impossible. He naps now in daytime, power-naps in the afternoon,of 30 minutes or less, feeling refreshed after wards.  He has had no sinus surgery, no tonsillectomy and no septal correction. His son has sleep apnea and used CPAP.  Interval history : on 05-27-14 Tommy [name] underwent a split night polysomnography.  His AHI was 44.6 his RDI 52.9 REM sleep was not seen in the first 2 hours. Interestingly supine in his sleep apnea index was lower than and when he slept on the right. The patient also had 232 periodic limb movements with arousal and index of 9.8. Once on CPAP, he was titrated to 10 cm water pressure ,but he seemed to have done the best at 9 cm - his AHI was reduced significantly. Oxygen nadir was not 83% but still with 31 minutes of desaturation. His PLM arousal index was still 13.6 saw CPAP but not necessarily address the issue. The AHI at 9 cm water pressure was 0.0. Unable today to see a download for the last 67 days since initially starting on in the machine. The patient had been using CPAP in the past and prefers a full face mask. His average time date using the machine is 5 hours and 52 minutes, his EPI level is still a stat pressure is 9 he still has an AHI of 9.4. He attributes this to a rather high air leak. The residual AHI on 9 cm water pressure right now is not satisfied is not satisfying as a school to 10 per hour.  He is on air sense auto set machine to [facility] I would like to and a pressure window between 7 and 12 cm water and allowed the patient to use the mask he prefers, which is a full face mask. Again he is used to sleep on the side and he uses a special CPAP pillow, configured to allow the mask to stay in place  REVIEW OF SYSTEMS: Out of a complete 14 system review of symptoms, the patient complains only of the following symptoms, and all other  reviewed systems are negative.  Upper sleepiness course endorsed at 7 points, fatigue severity score at 24 points, the patient does not indicate that he is depressed.  He feels that he sleeps well with CPAP that CPAP has improved his nocturnal sleep to be more sound and refreshing and restorative. Nocturia 1 time from 3 times before CPAP.   See history of present illness  ALLERGIES: Allergies  Allergen Reactions  .  Dorzolamide Hcl-Timolol Mal Other (See Comments)    dermatitis    HOME MEDICATIONS: Outpatient Medications Prior to Visit  Medication Sig Dispense Refill  . amLODipine (NORVASC) 10 MG tablet Take 10 mg by mouth every morning.    Marland Kitchen atorvastatin (LIPITOR) 20 MG tablet Take 20 mg by mouth every evening.    . fluticasone (FLONASE) 50 MCG/ACT nasal spray Place 2 sprays into the nose daily.    Marland Kitchen venlafaxine XR (EFFEXOR-XR) 75 MG 24 hr capsule Take 225 mg by mouth every morning.    Marland Kitchen aspirin 325 MG EC tablet Take 325 mg by mouth daily.    . benazepril (LOTENSIN) 40 MG tablet Take 40 mg by mouth every morning.    Marland Kitchen ofloxacin (FLOXIN) 0.3 % otic solution As needed    . timolol (TIMOPTIC) 0.5 % ophthalmic solution 1 drop 2 (two) times daily. Left eye     No facility-administered medications prior to visit.    PHYSICAL EXAM  Vitals:   10/31/17 1048  BP: (!) 154/100  Pulse: 86   There is no height or weight on file to calculate BMI.  Generalized: Well developed, in no acute distress  Neck: Circumference 19 inches, Mallampati 3+- invisible uvula  Morbidly obese. Short of breath. Click at the left TMJ, no retrognathia neck circumference 19 inches no delayed swallowing no palpable lymph nodes no goiter.  No pulmonary wheezing,  Some rhonchi, sinus congestion, nasal discharge.   Neurological examination  Mentation: Alert oriented to time, place, history taking. Follows all commands speech and language fluent Cranial nerve - no loss of sense of taste or smell, Pupils were  equally round and reactive to light. Ciliary injection, status post laser surgery for glaucoma on the left.Extraocular movements were full, visual field were full on confrontational test.  Facial sensation and strength were normal. Uvula and tongue midline. Head turning and shoulder shrug  were symmetric. Motor: The motor testing reveals symmetric motor tone. Strength was the left hand is stronger than with a right.  The patient is right-hand dominant. DTR symmetric, no rigor and no abnormal tone.  .  ASSESSMENT AND PLAN  This 25 minute Rv included more than 50% of face to face time and was dedicated to coordination of care , finding a new  DME.    76 y.o. year old male  has a past medical history of Arthritis, Hyperlipemia, Hypertension, Hypoxemia (09/02/2014), OSA on CPAP, PLMD (periodic limb movement disorder) (09/02/2014), Seasonal allergies, and Sleep apnea. here with:  1. Obstructive sleep apnea on CPAP, compliance visit - resmed air 10. I cannot confirm the settings today but this machine is AutoSet compatible. Upper pressure 12 cm water . Interface needs to be refitted due to very high air leaks.  This leads to an erroneously high apnea count. Uses a FFM> wants to change to a nasal pillow.    At the patient's previous office visit it was requested that his settings be changed to AutoSet 8-12 cm of water.   I will change him to [facility] and call in a new auto CPAP. 8-12 cm water   I will send another order to [facility] and have my assistant call so the changes can be made.    Larey Seat, MD  10/31/2017, 10:56 AM [facility] [address], [place], [place] [address] [phone]

## 2017-11-01 ENCOUNTER — Telehealth: Payer: Self-pay | Admitting: Neurology

## 2017-11-01 NOTE — Telephone Encounter (Signed)
Called the patient and reassured the patient that I did send the order and that should have everything they need. Pt verbalized understanding.

## 2017-11-01 NOTE — Telephone Encounter (Signed)
Pt has spoken with [facility] today, he said they did not rec' an order for a new machine which is what he wants. Please call to advise

## 2017-12-01 DIAGNOSIS — L821 Other seborrheic keratosis: Secondary | ICD-10-CM | POA: Diagnosis not present

## 2017-12-01 DIAGNOSIS — B351 Tinea unguium: Secondary | ICD-10-CM | POA: Diagnosis not present

## 2017-12-01 DIAGNOSIS — L918 Other hypertrophic disorders of the skin: Secondary | ICD-10-CM | POA: Diagnosis not present

## 2017-12-01 DIAGNOSIS — D225 Melanocytic nevi of trunk: Secondary | ICD-10-CM | POA: Diagnosis not present

## 2017-12-01 DIAGNOSIS — L218 Other seborrheic dermatitis: Secondary | ICD-10-CM | POA: Diagnosis not present

## 2017-12-01 DIAGNOSIS — L820 Inflamed seborrheic keratosis: Secondary | ICD-10-CM | POA: Diagnosis not present

## 2017-12-01 DIAGNOSIS — L738 Other specified follicular disorders: Secondary | ICD-10-CM | POA: Diagnosis not present

## 2017-12-01 DIAGNOSIS — L570 Actinic keratosis: Secondary | ICD-10-CM | POA: Diagnosis not present

## 2017-12-08 ENCOUNTER — Other Ambulatory Visit: Payer: Self-pay | Admitting: Neurology

## 2017-12-08 DIAGNOSIS — G4733 Obstructive sleep apnea (adult) (pediatric): Secondary | ICD-10-CM

## 2018-01-18 DIAGNOSIS — R7301 Impaired fasting glucose: Secondary | ICD-10-CM | POA: Diagnosis not present

## 2018-01-18 DIAGNOSIS — G4739 Other sleep apnea: Secondary | ICD-10-CM | POA: Diagnosis not present

## 2018-01-18 DIAGNOSIS — Z1389 Encounter for screening for other disorder: Secondary | ICD-10-CM | POA: Diagnosis not present

## 2018-01-18 DIAGNOSIS — F39 Unspecified mood [affective] disorder: Secondary | ICD-10-CM | POA: Diagnosis not present

## 2018-01-18 DIAGNOSIS — Z6841 Body Mass Index (BMI) 40.0 and over, adult: Secondary | ICD-10-CM | POA: Diagnosis not present

## 2018-01-18 DIAGNOSIS — I129 Hypertensive chronic kidney disease with stage 1 through stage 4 chronic kidney disease, or unspecified chronic kidney disease: Secondary | ICD-10-CM | POA: Diagnosis not present

## 2018-02-10 DIAGNOSIS — R1031 Right lower quadrant pain: Secondary | ICD-10-CM | POA: Diagnosis not present

## 2018-02-10 DIAGNOSIS — Z6841 Body Mass Index (BMI) 40.0 and over, adult: Secondary | ICD-10-CM | POA: Diagnosis not present

## 2018-02-13 DIAGNOSIS — H401123 Primary open-angle glaucoma, left eye, severe stage: Secondary | ICD-10-CM | POA: Diagnosis not present

## 2018-02-13 DIAGNOSIS — H401111 Primary open-angle glaucoma, right eye, mild stage: Secondary | ICD-10-CM | POA: Diagnosis not present

## 2018-02-18 ENCOUNTER — Encounter: Payer: Self-pay | Admitting: Adult Health

## 2018-02-20 ENCOUNTER — Encounter: Payer: Self-pay | Admitting: Adult Health

## 2018-02-20 ENCOUNTER — Ambulatory Visit (INDEPENDENT_AMBULATORY_CARE_PROVIDER_SITE_OTHER): Payer: Medicare Other | Admitting: Adult Health

## 2018-02-20 VITALS — BP 133/85 | HR 69 | Wt 264.4 lb

## 2018-02-20 DIAGNOSIS — Z9989 Dependence on other enabling machines and devices: Secondary | ICD-10-CM

## 2018-02-20 DIAGNOSIS — G4733 Obstructive sleep apnea (adult) (pediatric): Secondary | ICD-10-CM | POA: Diagnosis not present

## 2018-02-20 NOTE — Patient Instructions (Signed)
Your Plan:  Continue using cpap nightly Make sure straps to mask are tight enough to prevent leak. If your symptoms worsen or you develop new symptoms please let us know.   Thank you for coming to see [place] at [facility]. I hope we have been able to provide you high quality care today.  You may receive a patient satisfaction survey over the next few weeks. We would appreciate your feedback and comments so that we may continue to improve ourselves and the health of our patients.

## 2018-02-20 NOTE — Progress Notes (Signed)
PATIENT: Tommy [name] DOB: [birth year]  REASON FOR VISIT: follow up HISTORY FROM: patient  HISTORY OF PRESENT ILLNESS: Today 02/20/18   Tommy [name] is a 77 year old male with a history of obstructive sleep apnea on CPAP.  He returns today for follow-up.  His CPAP download indicates that he use his machine 30 out of 30 days for compliance of 100%.  He uses machine greater than 4 hours 28 out of 30 days for compliance of 93%.  On average he uses his machine 7 hours and 30 minutes.  He is on a minimum pressure of 8 cm of water and maximum pressure of 18 cm of water with EPR of 3.  His residual AHI is 7.3.  His leak in the 95th percentile is 30.7 L/min.  The patient states that he has been trying different mask within the last month.  He reports that the mask tends to not leak when he is laying supine but as soon as he lays on his side he has to readjust it.  Overall he feels that he is doing well.  He returns today for evaluation.  HISTORY 11-19- 2018 ([name])  I have the pleasure of seeing Tommy [name] today in person, he had the last 3 visits in our office with nurse practitioner Tommy [name].  Tommy [name] has been a compliant CPAP user,  100% for days of the last 30 days, 97% for over 4 hours compliance with an average  use time of 6 hours and 36 minutes.   The pressure window is between 8 and 15 cm of water with 2 cm expiratory pressure relief.  He is using a full facemask. which is 95th percentile pressure is 13.7, his AHI is 9.9 which is too high.  This seems to be related to air leaks.  He has very high air leaks in the 95th percentile of 33 l /min.  Today's visit is meant to establish that the patient is eligible for a new CPAP machine, as well as for new interface and head gear.  Tommy [name] is DME- his medicare record indicates the machine is 77 years old.     01-21-16 ([name]); Tommy [name] is a meanwhile 77 year old male with a history of obstructive sleep apnea on CPAP. He returns today  for a compliance visit . He lost his first wife after 64 years of marriage, has 4 sons. He remarried within a year, moved form his established [place]ighborhood out to [place].  His CPAP download shows that he uses his machine for 29 out of 30 days for compliance of 97%. He uses the machine greater than 4 hours 27 out of 30 days for compliance of 90%. On average he uses his machine 7 hours and 20 minutes. His AHI is 2.7 on 9 cm H20 of water with EPR of 2. The patient has a high air  leak in the 95th percentile at 31.9L/min.  patient states that he does not like to sleep on his back and when he turns to his sides that when the mask becomes dislodged. The patient also feels that the straps may be causing a rash. He usually goes to bed around 10:30 PM and arises at 7:30 AM. His Epworth sleepiness score is 11 was previously 13. Fatigue severity score is 22 was previously 14.     HISTORY 09/02/14 (CD): Tommy [name] is a married , caucasian 77 y.o. male , who is seen here as a referralfrom Dr. Dagmar Hait for a sleep apnea evaluation.  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 He was evaluated for sleep apnea at the age of 24 in December 2006. His sleep study at the [facility] documented an AHI of 58 the patient did not benefit from CPAP he used at home and returned for titration to [employer]- VPAP machine in January 2007.  VPAP machine was prescribed at the time with an EEP of 9 cm water and a pressure support setting between 3 and 10 cm water.  The patient states that he felt for many years well treated under these pressure settings. However his machine now is not longer functioning well he does note a supply him with the air he crates, it'll spontaneously switch off in the middle of nocturnal use. The download is not obtainable from the machine this age. The machine seems not longer to hold pressure. He was followed by  "sleep med" but as he became an  Medicare patient was referred for a new sleep apnea evaluation to document the current degree of apnea and if present to be titrate as of now on needed pressures. His current sleep habits are as follows- he goes to bed at 11PM but needs about 20-30 minutes to initiate sleep , waking up frequently , unsure why he wakes . He sleeps for the longest uninterrupted time about 2 hours. He has 3 nocturia breaks on average. He will assist his wife with some water etc.  He sleeps only 4-5 hours at the most. He wakes up spontaneously, needs only 15 minutes to rise and get ready. No Alarm used. He drinks decaffeinated tea and coffee, cola with caffeine 3-4 a week.  His morning is characterized by a dry mouth, but no headaches.  He craves 6-7 hours of sleep as he used to have when his machine worked well. He went to the gym 4 times weekly, since retirement in 03-2013 , but his wife illness now makes this impossible. He naps now in daytime, power-naps in the afternoon,of 30 minutes or less, feeling refreshed after wards.  He has had no sinus surgery, no tonsillectomy and no septal correction. His son has sleep apnea and used CPAP.  REVIEW OF SYSTEMS: Out of a complete 14 system review of symptoms, the patient complains only of the following symptoms, and all other reviewed systems are negative.  Epworth sleepiness score 10, fatigue severity score 23  ALLERGIES: Allergies  Allergen Reactions  . Dorzolamide Hcl-Timolol Mal Other (See Comments)    dermatitis    HOME MEDICATIONS: Outpatient Medications Prior to Visit  Medication Sig Dispense Refill  . amLODipine (NORVASC) 10 MG tablet Take 10 mg by mouth every morning.    Marland Kitchen atorvastatin (LIPITOR) 20 MG tablet Take 20 mg by mouth every evening.    . fluticasone (FLONASE) 50 MCG/ACT nasal spray Place 2 sprays into the nose daily.    . Multiple Vitamin (MULTIVITAMIN) tablet Take 1 tablet daily by mouth.    . nystatin-triamcinolone (MYCOLOG II) cream       . telmisartan (MICARDIS) 80 MG tablet     . venlafaxine XR (EFFEXOR-XR) 75 MG 24 hr capsule Take 225 mg by mouth every morning.    . vitamin E 400 UNIT capsule Take 400 Units daily by mouth.     No facility-administered medications prior to visit.     PAST MEDICAL HISTORY: Past Medical History:  Diagnosis Date  . Arthritis   . Hyperlipemia   . Hypertension   .  Hypoxemia 09/02/2014  . OSA on CPAP   . PLMD (periodic limb movement disorder) 09/02/2014  . Seasonal allergies   . Sleep apnea    uses a cpap    PAST SURGICAL HISTORY: Past Surgical History:  Procedure Laterality Date  . BUNIONECTOMY Right 01/03/2014   Procedure: RIGHT LAPIDUS BUNION CORRECTION;  Surgeon: Wylene Simmer, MD;  Location: [place];  Service: Orthopedics;  Laterality: Right;  . JOINT REPLACEMENT     bilateral knee replacemts  . JOINT REPLACEMENT     bilateral shoulder replacemts  . KNEE ARTHROSCOPY     right and left  . SHOULDER ARTHROSCOPY  2011   right  . TOTAL KNEE ARTHROPLASTY  2005   right  . TOTAL KNEE ARTHROPLASTY  2004   left  . TOTAL SHOULDER ARTHROPLASTY  2012   left    FAMILY HISTORY: Family History  Problem Relation Age of Onset  . Glaucoma Father   . Alzheimer's disease Mother   . Dementia Mother   . Heart Problems Maternal Grandfather   . Heart Problems Maternal Grandmother     SOCIAL HISTORY: Social History   Socioeconomic History  . Marital status: Married    Spouse name: Tommy Basta  . Number of children: 4  . Years of education: 52  . Highest education level: Not on file  Social Needs  . Financial resource strain: Not on file  . Food insecurity - worry: Not on file  . Food insecurity - inability: Not on file  . Transportation needs - medical: Not on file  . Transportation needs - non-medical: Not on file  Occupational History  . Not on file  Tobacco Use  . Smoking status: Former Smoker    Last attempt to quit: 01/20/1983    Years since quitting: 35.1   . Smokeless tobacco: Never Used  Substance and Sexual Activity  . Alcohol use: No    Alcohol/week: 0.0 oz  . Drug use: No  . Sexual activity: Not on file  Other Topics Concern  . Not on file  Social History Narrative   Patient lives at home alone and he is widowed.   Patient has four adult children.   Patient is retired.   Patient has a college education.   Patient is right-handed.   Patient drinks three-four sodas per week.      PHYSICAL EXAM  Vitals:   02/20/18 0743  BP: 133/85  Pulse: 69  Weight: 264 lb 6.4 oz (119.9 kg)   Body mass index is 42.68 kg/m.  Generalized: Well developed, in no acute distress   Neurological examination  Mentation: Alert oriented to time, place, history taking. Follows all commands speech and language fluent Cranial nerve II-XII: Pupils were equal round reactive to light. Extraocular movements were full, visual field were full on confrontational test. Facial sensation and strength were normal. Uvula tongue midline. Head turning and shoulder shrug  were normal and symmetric.  Neck circumference 19 inches, Mallampati 4+ Motor: The motor testing reveals 5 over 5 strength of all 4 extremities. Good symmetric motor tone is noted throughout.  Sensory: Sensory testing is intact to soft touch on all 4 extremities. No evidence of extinction is noted.  Coordination: Cerebellar testing reveals good finger-nose-finger and heel-to-shin bilaterally.  Gait and station: Gait is normal. Tandem gait is normal. Romberg is negative. No drift is seen.  Reflexes: Deep tendon reflexes are symmetric and normal bilaterally.   DIAGNOSTIC DATA (LABS, IMAGING, TESTING) - I reviewed patient records, labs,  notes, testing and imaging myself where available.  Lab Results  Component Value Date   WBC 6.8 10/04/2011   HGB 15.8 01/03/2014   HCT 47.0 10/04/2011   MCV 87.2 10/04/2011   PLT 211 10/04/2011      Component Value Date/Time   NA 144 01/01/2014 1457   K 3.7  01/01/2014 1457   CL 102 01/01/2014 1457   CO2 29 01/01/2014 1457   GLUCOSE 105 (H) 01/01/2014 1457   BUN 17 01/01/2014 1457   CREATININE 1.21 01/01/2014 1457   CALCIUM 9.4 01/01/2014 1457   PROT 7.5 10/04/2011 0957   ALBUMIN 4.0 10/04/2011 0957   AST 22 10/04/2011 0957   ALT 21 10/04/2011 0957   ALKPHOS 67 10/04/2011 0957   BILITOT 0.4 10/04/2011 0957   GFRNONAA 59 (L) 01/01/2014 1457   GFRAA 68 (L) 01/01/2014 1457   No results found for: CHOL, HDL, LDLCALC, LDLDIRECT, TRIG, CHOLHDL No results found for: HGBA1C No results found for: VITAMINB12 No results found for: TSH    ASSESSMENT AND PLAN 77 y.o. year old male  has a past medical history of Arthritis, Hyperlipemia, Hypertension, Hypoxemia (09/02/2014), OSA on CPAP, PLMD (periodic limb movement disorder) (09/02/2014), Seasonal allergies, and Sleep apnea. here with:  1.  Obstructive sleep apnea on CPAP  Overall the patient'compliance is excellent.  His residual AHI has improved  slightly but still remains elevated at 7.3.  He continues to have a significant leak.  The patient is in the process of trying different masks.  He is advised that he should make sure his straps for his mask are tight enough so that it does not leak when he moves around.  He is advised that if his symptoms worsen or he develops new symptoms he should let us know.  He will follow-up in 6 months or sooner if needed.  I spent 15 minutes with the patient. 50% of this time was spent discussing his sleep download.   Ward Givens, MSN, NP-C 02/20/2018, 8:09 AM [facility] [address] [place], [place] [address] [phone]

## 2018-02-21 NOTE — Progress Notes (Signed)
I agree with the assessment and plan as directed by NP .The patient is known to me .   Sabrin Dunlevy, MD  

## 2018-03-09 DIAGNOSIS — M1611 Unilateral primary osteoarthritis, right hip: Secondary | ICD-10-CM | POA: Diagnosis not present

## 2018-03-09 DIAGNOSIS — M25551 Pain in right hip: Secondary | ICD-10-CM | POA: Diagnosis not present

## 2018-03-15 DIAGNOSIS — M1611 Unilateral primary osteoarthritis, right hip: Secondary | ICD-10-CM | POA: Diagnosis not present

## 2018-04-14 DIAGNOSIS — M1611 Unilateral primary osteoarthritis, right hip: Secondary | ICD-10-CM | POA: Diagnosis not present

## 2018-04-17 DIAGNOSIS — H401111 Primary open-angle glaucoma, right eye, mild stage: Secondary | ICD-10-CM | POA: Diagnosis not present

## 2018-04-17 DIAGNOSIS — H401123 Primary open-angle glaucoma, left eye, severe stage: Secondary | ICD-10-CM | POA: Diagnosis not present

## 2018-05-16 DIAGNOSIS — R350 Frequency of micturition: Secondary | ICD-10-CM | POA: Diagnosis not present

## 2018-05-22 DIAGNOSIS — H401111 Primary open-angle glaucoma, right eye, mild stage: Secondary | ICD-10-CM | POA: Diagnosis not present

## 2018-05-22 DIAGNOSIS — H401123 Primary open-angle glaucoma, left eye, severe stage: Secondary | ICD-10-CM | POA: Diagnosis not present

## 2018-06-09 DIAGNOSIS — G4730 Sleep apnea, unspecified: Secondary | ICD-10-CM | POA: Diagnosis not present

## 2018-06-09 DIAGNOSIS — M1611 Unilateral primary osteoarthritis, right hip: Secondary | ICD-10-CM | POA: Diagnosis not present

## 2018-06-09 DIAGNOSIS — I129 Hypertensive chronic kidney disease with stage 1 through stage 4 chronic kidney disease, or unspecified chronic kidney disease: Secondary | ICD-10-CM | POA: Diagnosis not present

## 2018-06-09 DIAGNOSIS — Z6841 Body Mass Index (BMI) 40.0 and over, adult: Secondary | ICD-10-CM | POA: Diagnosis not present

## 2018-06-09 DIAGNOSIS — R7301 Impaired fasting glucose: Secondary | ICD-10-CM | POA: Diagnosis not present

## 2018-06-09 DIAGNOSIS — M25551 Pain in right hip: Secondary | ICD-10-CM | POA: Diagnosis not present

## 2018-06-09 DIAGNOSIS — F39 Unspecified mood [affective] disorder: Secondary | ICD-10-CM | POA: Diagnosis not present

## 2018-06-26 DIAGNOSIS — H401123 Primary open-angle glaucoma, left eye, severe stage: Secondary | ICD-10-CM | POA: Diagnosis not present

## 2018-06-26 DIAGNOSIS — H401111 Primary open-angle glaucoma, right eye, mild stage: Secondary | ICD-10-CM | POA: Diagnosis not present

## 2018-07-26 ENCOUNTER — Inpatient Hospital Stay: Admit: 2018-07-26 | Payer: Medicare Other | Admitting: Orthopedic Surgery

## 2018-07-26 SURGERY — ARTHROPLASTY, HIP, TOTAL, ANTERIOR APPROACH
Anesthesia: Choice | Site: Hip | Laterality: Right

## 2018-08-07 DIAGNOSIS — H2511 Age-related nuclear cataract, right eye: Secondary | ICD-10-CM | POA: Diagnosis not present

## 2018-08-07 DIAGNOSIS — H401123 Primary open-angle glaucoma, left eye, severe stage: Secondary | ICD-10-CM | POA: Diagnosis not present

## 2018-08-26 ENCOUNTER — Encounter: Payer: Self-pay | Admitting: Adult Health

## 2018-08-29 ENCOUNTER — Encounter: Payer: Self-pay | Admitting: Adult Health

## 2018-08-29 ENCOUNTER — Ambulatory Visit (INDEPENDENT_AMBULATORY_CARE_PROVIDER_SITE_OTHER): Payer: Medicare Other | Admitting: Adult Health

## 2018-08-29 VITALS — BP 144/88 | HR 73 | Ht 66.0 in | Wt 260.6 lb

## 2018-08-29 DIAGNOSIS — G4733 Obstructive sleep apnea (adult) (pediatric): Secondary | ICD-10-CM

## 2018-08-29 DIAGNOSIS — Z9989 Dependence on other enabling machines and devices: Secondary | ICD-10-CM | POA: Diagnosis not present

## 2018-08-29 NOTE — Progress Notes (Signed)
PATIENT: Tommy [name] DOB: [birth year]  REASON FOR VISIT: follow up HISTORY FROM: patient  HISTORY OF PRESENT ILLNESS: Today 08/29/18: Mr. Grimley is a 77 year old male with a history of obstructive sleep apnea on sleep.His CPAP he uses machine greater than 4 hours 29 out of 30 days for compliance of 97%.  On average he uses his machine 7 hours and 11 minutes.  His residual AHI is 8.8 on 8 to 18 cm of water.  His leak in the 95th percentile is 35.3 L/min.  He states that he is now using the DreamWear mask.  He has tried several different masks in the past but he reports that this is the most comfortable.  He states that when he lays on his back the mask does not leak.  However if he lays on his side sometimes the mask will dislodge. He returns today for an evaluation.  HISTORY Mr. Kervin is a 77 year old male with a history of obstructive sleep apnea on CPAP.  He returns today for follow-up.   He uses machine greater than 4 hours 28 out of 30 days for compliance of 93%.  On average he uses his machine 7 hours and 30 minutes.  He is on a minimum pressure of 8 cm of water and maximum pressure of 18 cm of water with EPR of 3.  His residual AHI is 7.3.  His leak in the 95th percentile is 30.7 L/min.  The patient states that he has been trying different mask within the last month.  He reports that the mask tends to not leak when he is laying supine but as soon as he lays on his side he has to readjust it.  Overall he feels that he is doing well.  He returns today for evaluation.   REVIEW OF SYSTEMS: Out of a complete 14 system review of symptoms, the patient complains only of the following symptoms, and all other reviewed systems are negative.  See HPI    ALLERGIES: Allergies  Allergen Reactions  . Dorzolamide Hcl-Timolol Mal Other (See Comments)    dermatitis    HOME MEDICATIONS: Outpatient Medications Prior to Visit  Medication Sig Dispense Refill  . amLODipine (NORVASC) 10 MG  tablet Take 10 mg by mouth every morning.    Marland Kitchen atorvastatin (LIPITOR) 20 MG tablet Take 20 mg by mouth every evening.    . brimonidine (ALPHAGAN) 0.2 % ophthalmic solution INSTILL 1 DROP INTO LEFT EYE TWICE DAILY  11  . brimonidine-timolol (COMBIGAN) 0.2-0.5 % ophthalmic solution Combigan 0.2 %-0.5 % eye drops    . dorzolamide-timolol (COSOPT) 22.3-6.8 MG/ML ophthalmic solution dorzolamide 22.3 mg-timolol 6.8 mg/mL eye drops    . fluticasone (FLONASE) 50 MCG/ACT nasal spray Place 2 sprays into the nose daily.    . Multiple Vitamin (MULTIVITAMIN) tablet Take 1 tablet daily by mouth.    . nystatin-triamcinolone (MYCOLOG II) cream     . tamsulosin (FLOMAX) 0.4 MG CAPS capsule as needed.    Marland Kitchen telmisartan (MICARDIS) 80 MG tablet     . venlafaxine XR (EFFEXOR-XR) 75 MG 24 hr capsule Take 225 mg by mouth every morning.    . vitamin E 400 UNIT capsule Take 400 Units daily by mouth.     No facility-administered medications prior to visit.     PAST MEDICAL HISTORY: Past Medical History:  Diagnosis Date  . Arthritis   . Hyperlipemia   . Hypertension   . Hypoxemia 09/02/2014  . OSA on CPAP   . PLMD (  periodic limb movement disorder) 09/02/2014  . Seasonal allergies   . Sleep apnea    uses a cpap    PAST SURGICAL HISTORY: Past Surgical History:  Procedure Laterality Date  . BUNIONECTOMY Right 01/03/2014   Procedure: RIGHT LAPIDUS BUNION CORRECTION;  Surgeon: Wylene Simmer, MD;  Location: [place];  Service: Orthopedics;  Laterality: Right;  . JOINT REPLACEMENT     bilateral knee replacemts  . JOINT REPLACEMENT     bilateral shoulder replacemts  . KNEE ARTHROSCOPY     right and left  . SHOULDER ARTHROSCOPY  2011   right  . TOTAL KNEE ARTHROPLASTY  2005   right  . TOTAL KNEE ARTHROPLASTY  2004   left  . TOTAL SHOULDER ARTHROPLASTY  2012   left    FAMILY HISTORY: Family History  Problem Relation Age of Onset  . Glaucoma Father   . Alzheimer's disease Mother   .  Dementia Mother   . Heart Problems Maternal Grandfather   . Heart Problems Maternal Grandmother     SOCIAL HISTORY: Social History   Socioeconomic History  . Marital status: Married    Spouse name: Vaughan Basta  . Number of children: 4  . Years of education: 52  . Highest education level: Not on file  Occupational History  . Not on file  Social Needs  . Financial resource strain: Not on file  . Food insecurity:    Worry: Not on file    Inability: Not on file  . Transportation needs:    Medical: Not on file    Non-medical: Not on file  Tobacco Use  . Smoking status: Former Smoker    Last attempt to quit: 01/20/1983    Years since quitting: 35.6  . Smokeless tobacco: Never Used  Substance and Sexual Activity  . Alcohol use: No    Alcohol/week: 0.0 standard drinks  . Drug use: No  . Sexual activity: Not on file  Lifestyle  . Physical activity:    Days per week: Not on file    Minutes per session: Not on file  . Stress: Not on file  Relationships  . Social connections:    Talks on phone: Not on file    Gets together: Not on file    Attends religious service: Not on file    Active member of club or organization: Not on file    Attends meetings of clubs or organizations: Not on file    Relationship status: Not on file  . Intimate partner violence:    Fear of current or ex partner: Not on file    Emotionally abused: Not on file    Physically abused: Not on file    Forced sexual activity: Not on file  Other Topics Concern  . Not on file  Social History Narrative   Patient lives at home alone and he is widowed.   Patient has four adult children.   Patient is retired.   Patient has a college education.   Patient is right-handed.   Patient drinks three-four sodas per week.      PHYSICAL EXAM  Vitals:   08/29/18 0845  BP: (!) 144/88  Pulse: 73  Weight: 260 lb 9.6 oz (118.2 kg)  Height: 5\' 6"  (1.676 m)   Body mass index is 42.06 kg/m.  Generalized: Well  developed, in no acute distress   Neurological examination  Mentation: Alert oriented to time, place, history taking. Follows all commands speech and language fluent Cranial nerve II-XII:  Pupils were equal round reactive to light. Extraocular movements were full, visual field were full on confrontational test. Facial sensation and strength were normal. Uvula tongue midline. Head turning and shoulder shrug  were normal and symmetric.  Neck circumference 19 inches, mild Motor: The motor testing reveals 5 over 5 strength of all 4 extremities. Good symmetric motor tone is noted throughout.  Sensory: Sensory testing is intact to soft touch on all 4 extremities. No evidence of extinction is noted.  Coordination: Cerebellar testing reveals good finger-nose-finger and heel-to-shin bilaterally.  Gait and station: Gait is normal.  Reflexes: Deep tendon reflexes are symmetric and normal bilaterally.   DIAGNOSTIC DATA (LABS, IMAGING, TESTING) - I reviewed patient records, labs, notes, testing and imaging myself where available.  Lab Results  Component Value Date   WBC 6.8 10/04/2011   HGB 15.8 01/03/2014   HCT 47.0 10/04/2011   MCV 87.2 10/04/2011   PLT 211 10/04/2011      Component Value Date/Time   NA 144 01/01/2014 1457   K 3.7 01/01/2014 1457   CL 102 01/01/2014 1457   CO2 29 01/01/2014 1457   GLUCOSE 105 (H) 01/01/2014 1457   BUN 17 01/01/2014 1457   CREATININE 1.21 01/01/2014 1457   CALCIUM 9.4 01/01/2014 1457   PROT 7.5 10/04/2011 0957   ALBUMIN 4.0 10/04/2011 0957   AST 22 10/04/2011 0957   ALT 21 10/04/2011 0957   ALKPHOS 67 10/04/2011 0957   BILITOT 0.4 10/04/2011 0957   GFRNONAA 1 (L) 01/01/2014 1457   GFRAA 68 (L) 01/01/2014 1457      ASSESSMENT AND PLAN 77 y.o. year old male  has a past medical history of Arthritis, Hyperlipemia, Hypertension, Hypoxemia (09/02/2014), OSA on CPAP, PLMD (periodic limb movement disorder) (09/02/2014), Seasonal allergies, and Sleep apnea.  here with:  1.  Obstructive sleep apnea on CPAP  The patient CPAP download shows a compliance however his residual AHI remains elevated.  He continues to have a leak with the mask.  However he has tried multiple different masks in the past and the DreamWear mask is the most comfortable for him.  He is encouraged to continue using the CPAP nightly and greater than 4 hours each night.  He will follow-up in 1 year or sooner if needed.   I spent 15 minutes with the patient. 50% of this time was spent reviewing his CPAP download  Ward Givens, MSN, NP-C 08/29/2018, 8:47 AM [facility] [address] [place], [place] [address] [phone]

## 2018-08-29 NOTE — Patient Instructions (Signed)
Your Plan:  Continue using CPAP nightly and >4 hours each night Make sure straps are tight to prevent mask from leaking If your symptoms worsen or you develop new symptoms please let us know.   Thank you for coming to see [place] at [facility]. I hope we have been able to provide you high quality care today.  You may receive a patient satisfaction survey over the next few weeks. We would appreciate your feedback and comments so that we may continue to improve ourselves and the health of our patients.

## 2018-09-01 DIAGNOSIS — H409 Unspecified glaucoma: Secondary | ICD-10-CM | POA: Diagnosis not present

## 2018-09-01 DIAGNOSIS — H269 Unspecified cataract: Secondary | ICD-10-CM | POA: Diagnosis not present

## 2018-09-01 DIAGNOSIS — H2511 Age-related nuclear cataract, right eye: Secondary | ICD-10-CM | POA: Diagnosis not present

## 2018-09-01 DIAGNOSIS — H401123 Primary open-angle glaucoma, left eye, severe stage: Secondary | ICD-10-CM | POA: Diagnosis not present

## 2018-09-01 DIAGNOSIS — Z961 Presence of intraocular lens: Secondary | ICD-10-CM | POA: Diagnosis not present

## 2018-09-01 DIAGNOSIS — Z01818 Encounter for other preprocedural examination: Secondary | ICD-10-CM | POA: Diagnosis not present

## 2018-09-07 DIAGNOSIS — I10 Essential (primary) hypertension: Secondary | ICD-10-CM | POA: Diagnosis not present

## 2018-09-07 DIAGNOSIS — H25811 Combined forms of age-related cataract, right eye: Secondary | ICD-10-CM | POA: Diagnosis not present

## 2018-09-07 DIAGNOSIS — H2511 Age-related nuclear cataract, right eye: Secondary | ICD-10-CM | POA: Diagnosis not present

## 2018-09-07 DIAGNOSIS — Z961 Presence of intraocular lens: Secondary | ICD-10-CM | POA: Diagnosis not present

## 2018-09-07 DIAGNOSIS — H401123 Primary open-angle glaucoma, left eye, severe stage: Secondary | ICD-10-CM | POA: Diagnosis not present

## 2018-09-07 DIAGNOSIS — H401111 Primary open-angle glaucoma, right eye, mild stage: Secondary | ICD-10-CM | POA: Diagnosis not present

## 2018-10-02 DIAGNOSIS — Z125 Encounter for screening for malignant neoplasm of prostate: Secondary | ICD-10-CM | POA: Diagnosis not present

## 2018-10-02 DIAGNOSIS — E7849 Other hyperlipidemia: Secondary | ICD-10-CM | POA: Diagnosis not present

## 2018-10-02 DIAGNOSIS — R82998 Other abnormal findings in urine: Secondary | ICD-10-CM | POA: Diagnosis not present

## 2018-10-02 DIAGNOSIS — R7301 Impaired fasting glucose: Secondary | ICD-10-CM | POA: Diagnosis not present

## 2018-10-02 DIAGNOSIS — I10 Essential (primary) hypertension: Secondary | ICD-10-CM | POA: Diagnosis not present

## 2018-10-09 DIAGNOSIS — E7849 Other hyperlipidemia: Secondary | ICD-10-CM | POA: Diagnosis not present

## 2018-10-09 DIAGNOSIS — I129 Hypertensive chronic kidney disease with stage 1 through stage 4 chronic kidney disease, or unspecified chronic kidney disease: Secondary | ICD-10-CM | POA: Diagnosis not present

## 2018-10-09 DIAGNOSIS — G4730 Sleep apnea, unspecified: Secondary | ICD-10-CM | POA: Diagnosis not present

## 2018-10-09 DIAGNOSIS — Z6841 Body Mass Index (BMI) 40.0 and over, adult: Secondary | ICD-10-CM | POA: Diagnosis not present

## 2018-10-09 DIAGNOSIS — J302 Other seasonal allergic rhinitis: Secondary | ICD-10-CM | POA: Diagnosis not present

## 2018-10-09 DIAGNOSIS — Z1389 Encounter for screening for other disorder: Secondary | ICD-10-CM | POA: Diagnosis not present

## 2018-10-09 DIAGNOSIS — M25551 Pain in right hip: Secondary | ICD-10-CM | POA: Diagnosis not present

## 2018-10-09 DIAGNOSIS — Z0000 Encounter for general adult medical examination without abnormal findings: Secondary | ICD-10-CM | POA: Diagnosis not present

## 2018-10-09 DIAGNOSIS — F39 Unspecified mood [affective] disorder: Secondary | ICD-10-CM | POA: Diagnosis not present

## 2018-10-09 DIAGNOSIS — Z23 Encounter for immunization: Secondary | ICD-10-CM | POA: Diagnosis not present

## 2018-10-09 DIAGNOSIS — R7301 Impaired fasting glucose: Secondary | ICD-10-CM | POA: Diagnosis not present

## 2018-10-23 DIAGNOSIS — H401123 Primary open-angle glaucoma, left eye, severe stage: Secondary | ICD-10-CM | POA: Diagnosis not present

## 2018-10-23 DIAGNOSIS — H401111 Primary open-angle glaucoma, right eye, mild stage: Secondary | ICD-10-CM | POA: Diagnosis not present

## 2018-10-23 DIAGNOSIS — Z961 Presence of intraocular lens: Secondary | ICD-10-CM | POA: Diagnosis not present

## 2018-11-07 DIAGNOSIS — M25561 Pain in right knee: Secondary | ICD-10-CM | POA: Diagnosis not present

## 2018-11-07 DIAGNOSIS — M1611 Unilateral primary osteoarthritis, right hip: Secondary | ICD-10-CM | POA: Diagnosis not present

## 2018-11-28 DIAGNOSIS — M1611 Unilateral primary osteoarthritis, right hip: Secondary | ICD-10-CM | POA: Diagnosis not present

## 2018-12-26 DIAGNOSIS — H401111 Primary open-angle glaucoma, right eye, mild stage: Secondary | ICD-10-CM | POA: Diagnosis not present

## 2018-12-26 DIAGNOSIS — H401123 Primary open-angle glaucoma, left eye, severe stage: Secondary | ICD-10-CM | POA: Diagnosis not present

## 2019-01-18 DIAGNOSIS — L814 Other melanin hyperpigmentation: Secondary | ICD-10-CM | POA: Diagnosis not present

## 2019-01-18 DIAGNOSIS — L918 Other hypertrophic disorders of the skin: Secondary | ICD-10-CM | POA: Diagnosis not present

## 2019-01-18 DIAGNOSIS — L570 Actinic keratosis: Secondary | ICD-10-CM | POA: Diagnosis not present

## 2019-01-18 DIAGNOSIS — L821 Other seborrheic keratosis: Secondary | ICD-10-CM | POA: Diagnosis not present

## 2019-01-19 DIAGNOSIS — G4730 Sleep apnea, unspecified: Secondary | ICD-10-CM | POA: Diagnosis not present

## 2019-01-19 DIAGNOSIS — I10 Essential (primary) hypertension: Secondary | ICD-10-CM | POA: Diagnosis not present

## 2019-01-19 DIAGNOSIS — F39 Unspecified mood [affective] disorder: Secondary | ICD-10-CM | POA: Diagnosis not present

## 2019-01-19 DIAGNOSIS — J302 Other seasonal allergic rhinitis: Secondary | ICD-10-CM | POA: Diagnosis not present

## 2019-01-19 DIAGNOSIS — Z6841 Body Mass Index (BMI) 40.0 and over, adult: Secondary | ICD-10-CM | POA: Diagnosis not present

## 2019-01-19 DIAGNOSIS — M25551 Pain in right hip: Secondary | ICD-10-CM | POA: Diagnosis not present

## 2019-01-19 DIAGNOSIS — R7301 Impaired fasting glucose: Secondary | ICD-10-CM | POA: Diagnosis not present

## 2019-01-19 DIAGNOSIS — Z1331 Encounter for screening for depression: Secondary | ICD-10-CM | POA: Diagnosis not present

## 2019-01-29 DIAGNOSIS — H401111 Primary open-angle glaucoma, right eye, mild stage: Secondary | ICD-10-CM | POA: Diagnosis not present

## 2019-03-09 DIAGNOSIS — H401123 Primary open-angle glaucoma, left eye, severe stage: Secondary | ICD-10-CM | POA: Diagnosis not present

## 2019-04-02 DIAGNOSIS — H401111 Primary open-angle glaucoma, right eye, mild stage: Secondary | ICD-10-CM | POA: Diagnosis not present

## 2019-04-02 DIAGNOSIS — H401123 Primary open-angle glaucoma, left eye, severe stage: Secondary | ICD-10-CM | POA: Diagnosis not present

## 2019-04-17 DIAGNOSIS — R319 Hematuria, unspecified: Secondary | ICD-10-CM | POA: Diagnosis not present

## 2019-04-17 DIAGNOSIS — R300 Dysuria: Secondary | ICD-10-CM | POA: Diagnosis not present

## 2019-04-17 DIAGNOSIS — N200 Calculus of kidney: Secondary | ICD-10-CM | POA: Diagnosis not present

## 2019-04-18 DIAGNOSIS — N281 Cyst of kidney, acquired: Secondary | ICD-10-CM | POA: Diagnosis not present

## 2019-04-18 DIAGNOSIS — R319 Hematuria, unspecified: Secondary | ICD-10-CM | POA: Diagnosis not present

## 2019-04-20 DIAGNOSIS — R350 Frequency of micturition: Secondary | ICD-10-CM | POA: Diagnosis not present

## 2019-04-20 DIAGNOSIS — R310 Gross hematuria: Secondary | ICD-10-CM | POA: Diagnosis not present

## 2019-05-01 DIAGNOSIS — R310 Gross hematuria: Secondary | ICD-10-CM | POA: Diagnosis not present

## 2019-05-01 DIAGNOSIS — R351 Nocturia: Secondary | ICD-10-CM | POA: Diagnosis not present

## 2019-05-01 DIAGNOSIS — R350 Frequency of micturition: Secondary | ICD-10-CM | POA: Diagnosis not present

## 2019-06-05 DIAGNOSIS — G4730 Sleep apnea, unspecified: Secondary | ICD-10-CM | POA: Diagnosis not present

## 2019-06-05 DIAGNOSIS — I10 Essential (primary) hypertension: Secondary | ICD-10-CM | POA: Diagnosis not present

## 2019-06-05 DIAGNOSIS — F39 Unspecified mood [affective] disorder: Secondary | ICD-10-CM | POA: Diagnosis not present

## 2019-06-05 DIAGNOSIS — J302 Other seasonal allergic rhinitis: Secondary | ICD-10-CM | POA: Diagnosis not present

## 2019-06-05 DIAGNOSIS — R7301 Impaired fasting glucose: Secondary | ICD-10-CM | POA: Diagnosis not present

## 2019-06-05 DIAGNOSIS — R319 Hematuria, unspecified: Secondary | ICD-10-CM | POA: Diagnosis not present

## 2019-09-01 ENCOUNTER — Encounter: Payer: Self-pay | Admitting: Adult Health

## 2019-09-04 ENCOUNTER — Other Ambulatory Visit: Payer: Self-pay

## 2019-09-04 ENCOUNTER — Ambulatory Visit (INDEPENDENT_AMBULATORY_CARE_PROVIDER_SITE_OTHER): Payer: Medicare Other | Admitting: Adult Health

## 2019-09-04 VITALS — BP 148/90 | HR 82 | Temp 97.8°F | Ht 66.0 in | Wt 275.8 lb

## 2019-09-04 DIAGNOSIS — G4733 Obstructive sleep apnea (adult) (pediatric): Secondary | ICD-10-CM

## 2019-09-04 DIAGNOSIS — Z9989 Dependence on other enabling machines and devices: Secondary | ICD-10-CM

## 2019-09-04 NOTE — Patient Instructions (Signed)
Continue using CPAP nightly and greater than 4 hours each night °If your symptoms worsen or you develop new symptoms please let us know.  ° °

## 2019-09-04 NOTE — Progress Notes (Signed)
PATIENT: Tommy [name] DOB: May 07, 1941  REASON FOR VISIT: follow up HISTORY FROM: patient  HISTORY OF PRESENT ILLNESS: Today 09/04/19: [name] is a 78 year old male with a history of obstructive sleep apnea on CPAP.  His download indicates that he uses machine nightly for compliance of 100%.  He uses machine greater than 4 hours 27 days for compliance of 90%.  He uses machine on average 6 hours and 32 minutes.  His residual AHI is 6.3 on 8 to 18 cm of water with EPR of 3.  His leak in the 95th percentile is 24.1 L/min.  He states that night he does feel the mask leaking.  He has tried adjusting and that sometimes will fix the leak.  He denies any new issues.  He returns today for an evaluation.  HISTORY 08/29/18: [name] is a 78 year old male with a history of obstructive sleep apnea on sleep.His CPAP he uses machine greater than 4 hours 29 out of 30 days for compliance of 97%.  On average he uses his machine 7 hours and 11 minutes.  His residual AHI is 8.8 on 8 to 18 cm of water.  His leak in the 95th percentile is 35.3 L/min.  He states that he is now using the DreamWear mask.  He has tried several different masks in the past but he reports that this is the most comfortable.  He states that when he lays on his back the mask does not leak.  However if he lays on his side sometimes the mask will dislodge. He returns today for an evaluation.  REVIEW OF SYSTEMS: Out of a complete 14 system review of symptoms, the patient complains only of the following symptoms, and all other reviewed systems are negative. Epworth sleepiness score 7  ALLERGIES: Allergies  Allergen Reactions  . Dorzolamide Hcl-Timolol Mal Other (See Comments)    dermatitis    HOME MEDICATIONS: Outpatient Medications Prior to Visit  Medication Sig Dispense Refill  . amLODipine (NORVASC) 10 MG tablet Take 10 mg by mouth every morning.    Marland Kitchen atorvastatin (LIPITOR) 20 MG tablet Take 20 mg by mouth every evening.     . brimonidine (ALPHAGAN) 0.2 % ophthalmic solution INSTILL 1 DROP INTO LEFT EYE TWICE DAILY  11  . brimonidine-timolol (COMBIGAN) 0.2-0.5 % ophthalmic solution Combigan 0.2 %-0.5 % eye drops    . dorzolamide-timolol (COSOPT) 22.3-6.8 MG/ML ophthalmic solution dorzolamide 22.3 mg-timolol 6.8 mg/mL eye drops    . fluticasone (FLONASE) 50 MCG/ACT nasal spray Place 2 sprays into the nose daily.    . Multiple Vitamin (MULTIVITAMIN) tablet Take 1 tablet daily by mouth.    . nystatin-triamcinolone (MYCOLOG II) cream     . tamsulosin (FLOMAX) 0.4 MG CAPS capsule as needed.    Marland Kitchen telmisartan (MICARDIS) 80 MG tablet     . venlafaxine XR (EFFEXOR-XR) 75 MG 24 hr capsule Take 225 mg by mouth every morning.    . vitamin E 400 UNIT capsule Take 400 Units daily by mouth.     No facility-administered medications prior to visit.     PAST MEDICAL HISTORY: Past Medical History:  Diagnosis Date  . Arthritis   . Hyperlipemia   . Hypertension   . Hypoxemia 09/02/2014  . OSA on CPAP   . PLMD (periodic limb movement disorder) 09/02/2014  . Seasonal allergies   . Sleep apnea    uses a cpap    PAST SURGICAL HISTORY: Past Surgical History:  Procedure Laterality Date  .  BUNIONECTOMY Right 01/03/2014   Procedure: RIGHT LAPIDUS BUNION CORRECTION;  Surgeon: Wylene Simmer, MD;  Location: [facility];  Service: Orthopedics;  Laterality: Right;  . JOINT REPLACEMENT     bilateral knee replacemts  . JOINT REPLACEMENT     bilateral shoulder replacemts  . KNEE ARTHROSCOPY     right and left  . SHOULDER ARTHROSCOPY  2011   right  . TOTAL KNEE ARTHROPLASTY  2005   right  . TOTAL KNEE ARTHROPLASTY  2004   left  . TOTAL SHOULDER ARTHROPLASTY  2012   left    FAMILY HISTORY: Family History  Problem Relation Age of Onset  . Glaucoma Father   . Alzheimer's disease Mother   . Dementia Mother   . Heart Problems Maternal Grandfather   . Heart Problems Maternal Grandmother     SOCIAL HISTORY:  Social History   Socioeconomic History  . Marital status: Married    Spouse name: Vaughan Basta  . Number of children: 4  . Years of education: 1  . Highest education level: Not on file  Occupational History  . Not on file  Social Needs  . Financial resource strain: Not on file  . Food insecurity    Worry: Not on file    Inability: Not on file  . Transportation needs    Medical: Not on file    Non-medical: Not on file  Tobacco Use  . Smoking status: Former Smoker    Quit date: 01/20/1983    Years since quitting: 36.6  . Smokeless tobacco: Never Used  Substance and Sexual Activity  . Alcohol use: No    Alcohol/week: 0.0 standard drinks  . Drug use: No  . Sexual activity: Not on file  Lifestyle  . Physical activity    Days per week: Not on file    Minutes per session: Not on file  . Stress: Not on file  Relationships  . Social Herbalist on phone: Not on file    Gets together: Not on file    Attends religious service: Not on file    Active member of club or organization: Not on file    Attends meetings of clubs or organizations: Not on file    Relationship status: Not on file  . Intimate partner violence    Fear of current or ex partner: Not on file    Emotionally abused: Not on file    Physically abused: Not on file    Forced sexual activity: Not on file  Other Topics Concern  . Not on file  Social History Narrative   Patient lives at home alone and he is widowed.   Patient has four adult children.   Patient is retired.   Patient has a college education.   Patient is right-handed.   Patient drinks three-four sodas per week.      PHYSICAL EXAM  Vitals:   09/04/19 0912  BP: (!) 148/90  Pulse: 82  Temp: 97.8 F (36.6 C)  SpO2: 94%  Weight: 275 lb 12.8 oz (125.1 kg)  Height: 5\' 6"  (1.676 m)   Body mass index is 44.52 kg/m.  Generalized: Well developed, in no acute distress  Chest: Lungs clear to auscultation bilaterally  Neurological  examination  Mentation: Alert oriented to time, place, history taking. Follows all commands speech and language fluent Cranial nerve II-XII: Extraocular movements were full, visual field were full on confrontational test Head turning and shoulder shrug  were normal and symmetric. Motor:  The motor testing reveals 5 over 5 strength of all 4 extremities. Good symmetric motor tone is noted throughout.  Sensory: Sensory testing is intact to soft touch on all 4 extremities. No evidence of extinction is noted.  Gait and station: Gait is normal.  .   DIAGNOSTIC DATA (LABS, IMAGING, TESTING) - I reviewed patient records, labs, notes, testing and imaging myself where available.  Lab Results  Component Value Date   WBC 6.8 10/04/2011   HGB 15.8 01/03/2014   HCT 47.0 10/04/2011   MCV 87.2 10/04/2011   PLT 211 10/04/2011      Component Value Date/Time   NA 144 01/01/2014 1457   K 3.7 01/01/2014 1457   CL 102 01/01/2014 1457   CO2 29 01/01/2014 1457   GLUCOSE 105 (H) 01/01/2014 1457   BUN 17 01/01/2014 1457   CREATININE 1.21 01/01/2014 1457   CALCIUM 9.4 01/01/2014 1457   PROT 7.5 10/04/2011 0957   ALBUMIN 4.0 10/04/2011 0957   AST 22 10/04/2011 0957   ALT 21 10/04/2011 0957   ALKPHOS 67 10/04/2011 0957   BILITOT 0.4 10/04/2011 0957   GFRNONAA 24 (L) 01/01/2014 1457   GFRAA 68 (L) 01/01/2014 1457      ASSESSMENT AND PLAN 78 y.o. year old male  has a past medical history of Arthritis, Hyperlipemia, Hypertension, Hypoxemia (09/02/2014), OSA on CPAP, PLMD (periodic limb movement disorder) (09/02/2014), Seasonal allergies, and Sleep apnea. here with:  1. Obstructive sleep apnea on CPAP  The patient's CPAP download shows excellent compliance and good treatment of his apnea.  He is encouraged to continue using CPAP nightly and greater than 4 hours each night.  He is advised that if his symptoms worsen or he develops new symptoms he should let us know.  He will follow-up in 1 year or sooner if  needed    I spent 15 minutes with the patient. 50% of this time was spent reviewing CPAP download   Ward Givens, MSN, NP-C 09/04/2019, 9:21 AM [facility] [address], [place], [place] [address] [phone]

## 2019-09-22 DIAGNOSIS — Z23 Encounter for immunization: Secondary | ICD-10-CM | POA: Diagnosis not present

## 2019-10-18 DIAGNOSIS — R7301 Impaired fasting glucose: Secondary | ICD-10-CM | POA: Diagnosis not present

## 2019-10-18 DIAGNOSIS — Z125 Encounter for screening for malignant neoplasm of prostate: Secondary | ICD-10-CM | POA: Diagnosis not present

## 2019-10-18 DIAGNOSIS — E7849 Other hyperlipidemia: Secondary | ICD-10-CM | POA: Diagnosis not present

## 2019-10-22 DIAGNOSIS — I10 Essential (primary) hypertension: Secondary | ICD-10-CM | POA: Diagnosis not present

## 2019-10-22 DIAGNOSIS — R82998 Other abnormal findings in urine: Secondary | ICD-10-CM | POA: Diagnosis not present

## 2019-10-25 DIAGNOSIS — F39 Unspecified mood [affective] disorder: Secondary | ICD-10-CM | POA: Diagnosis not present

## 2019-10-25 DIAGNOSIS — H409 Unspecified glaucoma: Secondary | ICD-10-CM | POA: Diagnosis not present

## 2019-10-25 DIAGNOSIS — Z0000 Encounter for general adult medical examination without abnormal findings: Secondary | ICD-10-CM | POA: Diagnosis not present

## 2019-10-25 DIAGNOSIS — Z1331 Encounter for screening for depression: Secondary | ICD-10-CM | POA: Diagnosis not present

## 2019-10-25 DIAGNOSIS — M25551 Pain in right hip: Secondary | ICD-10-CM | POA: Diagnosis not present

## 2019-10-25 DIAGNOSIS — R7301 Impaired fasting glucose: Secondary | ICD-10-CM | POA: Diagnosis not present

## 2019-10-25 DIAGNOSIS — J302 Other seasonal allergic rhinitis: Secondary | ICD-10-CM | POA: Diagnosis not present

## 2019-10-25 DIAGNOSIS — G4730 Sleep apnea, unspecified: Secondary | ICD-10-CM | POA: Diagnosis not present

## 2019-10-25 DIAGNOSIS — R319 Hematuria, unspecified: Secondary | ICD-10-CM | POA: Diagnosis not present

## 2019-10-25 DIAGNOSIS — E785 Hyperlipidemia, unspecified: Secondary | ICD-10-CM | POA: Diagnosis not present

## 2019-10-25 DIAGNOSIS — I10 Essential (primary) hypertension: Secondary | ICD-10-CM | POA: Diagnosis not present

## 2020-02-08 ENCOUNTER — Ambulatory Visit: Payer: Medicare HMO | Attending: Internal Medicine

## 2020-02-08 DIAGNOSIS — Z23 Encounter for immunization: Secondary | ICD-10-CM | POA: Insufficient documentation

## 2020-02-08 NOTE — Progress Notes (Signed)
   Covid-19 Vaccination Clinic  Name:  Tommy [name]    MRN: [id number] DOB: [birth year]  02/08/2020  Tommy [name] was observed post Covid-19 immunization for 15 minutes without incidence. He was provided with Vaccine Information Sheet and instruction to access the V-Safe system.   Tommy [name] was instructed to call 911 with any severe reactions post vaccine: Marland Kitchen Difficulty breathing  . Swelling of your face and throat  . A fast heartbeat  . A bad rash all over your body  . Dizziness and weakness    Immunizations Administered    Name Date Dose VIS Date Route   Pfizer COVID-19 Vaccine 02/08/2020  9:08 AM 0.3 mL 11/23/2019 Intramuscular   Manufacturer: [place]   Lot: [id number]   [place]: [id number]

## 2020-03-04 ENCOUNTER — Ambulatory Visit: Payer: Managed Care, Other (non HMO) | Attending: Internal Medicine

## 2020-03-04 DIAGNOSIS — Z23 Encounter for immunization: Secondary | ICD-10-CM

## 2020-03-04 NOTE — Progress Notes (Signed)
   Covid-19 Vaccination Clinic  Name:  Tommy [name]    MRN: [id number] DOB: Dec 04, 1941  03/04/2020  Tommy [name] was observed post Covid-19 immunization for 15 minutes without incident. He was provided with Vaccine Information Sheet and instruction to access the V-Safe system.   Tommy [name] was instructed to call 911 with any severe reactions post vaccine: Marland Kitchen Difficulty breathing  . Swelling of face and throat  . A fast heartbeat  . A bad rash all over body  . Dizziness and weakness   Immunizations Administered    Name Date Dose VIS Date Route   Pfizer COVID-19 Vaccine 03/04/2020 10:15 AM 0.3 mL 11/23/2019 Intramuscular   Manufacturer: [place]   Lot: [id number]   [place]: [id number]

## 2020-03-12 DIAGNOSIS — G4730 Sleep apnea, unspecified: Secondary | ICD-10-CM | POA: Diagnosis not present

## 2020-03-12 DIAGNOSIS — E785 Hyperlipidemia, unspecified: Secondary | ICD-10-CM | POA: Diagnosis not present

## 2020-03-12 DIAGNOSIS — F39 Unspecified mood [affective] disorder: Secondary | ICD-10-CM | POA: Diagnosis not present

## 2020-03-12 DIAGNOSIS — I10 Essential (primary) hypertension: Secondary | ICD-10-CM | POA: Diagnosis not present

## 2020-03-12 DIAGNOSIS — R7301 Impaired fasting glucose: Secondary | ICD-10-CM | POA: Diagnosis not present

## 2020-03-12 DIAGNOSIS — M25551 Pain in right hip: Secondary | ICD-10-CM | POA: Diagnosis not present

## 2020-03-20 DIAGNOSIS — L814 Other melanin hyperpigmentation: Secondary | ICD-10-CM | POA: Diagnosis not present

## 2020-03-20 DIAGNOSIS — D1801 Hemangioma of skin and subcutaneous tissue: Secondary | ICD-10-CM | POA: Diagnosis not present

## 2020-03-20 DIAGNOSIS — D692 Other nonthrombocytopenic purpura: Secondary | ICD-10-CM | POA: Diagnosis not present

## 2020-03-20 DIAGNOSIS — L821 Other seborrheic keratosis: Secondary | ICD-10-CM | POA: Diagnosis not present

## 2020-03-20 DIAGNOSIS — L918 Other hypertrophic disorders of the skin: Secondary | ICD-10-CM | POA: Diagnosis not present

## 2020-03-20 DIAGNOSIS — L820 Inflamed seborrheic keratosis: Secondary | ICD-10-CM | POA: Diagnosis not present

## 2020-03-20 DIAGNOSIS — D225 Melanocytic nevi of trunk: Secondary | ICD-10-CM | POA: Diagnosis not present

## 2020-04-16 DIAGNOSIS — H401111 Primary open-angle glaucoma, right eye, mild stage: Secondary | ICD-10-CM | POA: Diagnosis not present

## 2020-04-16 DIAGNOSIS — H401123 Primary open-angle glaucoma, left eye, severe stage: Secondary | ICD-10-CM | POA: Diagnosis not present

## 2020-04-17 DIAGNOSIS — R7301 Impaired fasting glucose: Secondary | ICD-10-CM | POA: Diagnosis not present

## 2020-04-17 DIAGNOSIS — S59902A Unspecified injury of left elbow, initial encounter: Secondary | ICD-10-CM | POA: Diagnosis not present

## 2020-04-17 DIAGNOSIS — I10 Essential (primary) hypertension: Secondary | ICD-10-CM | POA: Diagnosis not present

## 2020-04-21 DIAGNOSIS — M25522 Pain in left elbow: Secondary | ICD-10-CM | POA: Diagnosis not present

## 2020-04-24 DIAGNOSIS — M25522 Pain in left elbow: Secondary | ICD-10-CM | POA: Diagnosis not present

## 2020-04-24 DIAGNOSIS — M66829 Spontaneous rupture of other tendons, unspecified upper arm: Secondary | ICD-10-CM | POA: Diagnosis not present

## 2020-04-24 DIAGNOSIS — M66822 Spontaneous rupture of other tendons, left upper arm: Secondary | ICD-10-CM | POA: Diagnosis not present

## 2020-04-28 DIAGNOSIS — H401123 Primary open-angle glaucoma, left eye, severe stage: Secondary | ICD-10-CM | POA: Diagnosis not present

## 2020-04-28 DIAGNOSIS — H401111 Primary open-angle glaucoma, right eye, mild stage: Secondary | ICD-10-CM | POA: Diagnosis not present

## 2020-04-29 ENCOUNTER — Other Ambulatory Visit (HOSPITAL_COMMUNITY)
Admission: RE | Admit: 2020-04-29 | Discharge: 2020-04-29 | Disposition: A | Discharge status: Home / Self Care | Payer: Medicare HMO | Source: Ambulatory Visit | Attending: Orthopedic Surgery | Admitting: Orthopedic Surgery

## 2020-04-29 DIAGNOSIS — Z20822 Contact with and (suspected) exposure to covid-19: Secondary | ICD-10-CM | POA: Insufficient documentation

## 2020-04-29 DIAGNOSIS — Z01812 Encounter for preprocedural laboratory examination: Secondary | ICD-10-CM | POA: Insufficient documentation

## 2020-04-29 LAB — SARS CORONAVIRUS 2 (TAT 6-24 HRS): SARS Coronavirus 2: NEGATIVE

## 2020-05-01 ENCOUNTER — Encounter (HOSPITAL_COMMUNITY): Payer: Self-pay | Admitting: Orthopedic Surgery

## 2020-05-01 ENCOUNTER — Other Ambulatory Visit: Payer: Self-pay

## 2020-05-01 NOTE — Progress Notes (Signed)
Spoke with pt for pre-op call. Pt denies cardiac history, is treated for Hypertension. Pt is on Ozempic, but is not diabetic. States he uses that for weight loss.  Covid test done on 04/29/20 and it's negative. Pt states he has been in quarantine since the test was done and understands to stay in quarantine until he comes to the hospital.

## 2020-05-02 ENCOUNTER — Observation Stay (HOSPITAL_COMMUNITY)
Admission: RE | Admit: 2020-05-02 | Discharge: 2020-05-03 | Disposition: A | Discharge status: Home / Self Care | Payer: Medicare HMO | Attending: Orthopedic Surgery | Admitting: Orthopedic Surgery

## 2020-05-02 ENCOUNTER — Ambulatory Visit (HOSPITAL_COMMUNITY): Payer: Medicare HMO

## 2020-05-02 ENCOUNTER — Ambulatory Visit (HOSPITAL_COMMUNITY): Payer: Medicare HMO | Admitting: Certified Registered Nurse Anesthetist

## 2020-05-02 ENCOUNTER — Encounter (HOSPITAL_COMMUNITY): Payer: Self-pay | Admitting: Orthopedic Surgery

## 2020-05-02 ENCOUNTER — Encounter (HOSPITAL_COMMUNITY)
Admission: RE | Disposition: A | Discharge status: Home / Self Care | Payer: Self-pay | Source: Home / Self Care | Attending: Orthopedic Surgery

## 2020-05-02 ENCOUNTER — Other Ambulatory Visit: Payer: Self-pay

## 2020-05-02 DIAGNOSIS — S5012XA Contusion of left forearm, initial encounter: Secondary | ICD-10-CM | POA: Diagnosis not present

## 2020-05-02 DIAGNOSIS — Z7982 Long term (current) use of aspirin: Secondary | ICD-10-CM | POA: Diagnosis not present

## 2020-05-02 DIAGNOSIS — I10 Essential (primary) hypertension: Secondary | ICD-10-CM | POA: Insufficient documentation

## 2020-05-02 DIAGNOSIS — E785 Hyperlipidemia, unspecified: Secondary | ICD-10-CM | POA: Diagnosis not present

## 2020-05-02 DIAGNOSIS — Z9989 Dependence on other enabling machines and devices: Secondary | ICD-10-CM | POA: Diagnosis not present

## 2020-05-02 DIAGNOSIS — Z79899 Other long term (current) drug therapy: Secondary | ICD-10-CM | POA: Insufficient documentation

## 2020-05-02 DIAGNOSIS — G8918 Other acute postprocedural pain: Secondary | ICD-10-CM | POA: Diagnosis not present

## 2020-05-02 DIAGNOSIS — M719 Bursopathy, unspecified: Secondary | ICD-10-CM | POA: Diagnosis not present

## 2020-05-02 DIAGNOSIS — S46312A Strain of muscle, fascia and tendon of triceps, left arm, initial encounter: Secondary | ICD-10-CM | POA: Diagnosis not present

## 2020-05-02 DIAGNOSIS — S4402XA Injury of ulnar nerve at upper arm level, left arm, initial encounter: Secondary | ICD-10-CM | POA: Diagnosis not present

## 2020-05-02 DIAGNOSIS — Z6841 Body Mass Index (BMI) 40.0 and over, adult: Secondary | ICD-10-CM | POA: Insufficient documentation

## 2020-05-02 DIAGNOSIS — X58XXXA Exposure to other specified factors, initial encounter: Secondary | ICD-10-CM | POA: Insufficient documentation

## 2020-05-02 DIAGNOSIS — M1990 Unspecified osteoarthritis, unspecified site: Secondary | ICD-10-CM | POA: Diagnosis not present

## 2020-05-02 DIAGNOSIS — Z87891 Personal history of nicotine dependence: Secondary | ICD-10-CM | POA: Insufficient documentation

## 2020-05-02 DIAGNOSIS — M66822 Spontaneous rupture of other tendons, left upper arm: Secondary | ICD-10-CM | POA: Diagnosis not present

## 2020-05-02 DIAGNOSIS — R2689 Other abnormalities of gait and mobility: Secondary | ICD-10-CM | POA: Insufficient documentation

## 2020-05-02 DIAGNOSIS — M7022 Olecranon bursitis, left elbow: Secondary | ICD-10-CM | POA: Diagnosis not present

## 2020-05-02 DIAGNOSIS — G5742 Lesion of medial popliteal nerve, left lower limb: Secondary | ICD-10-CM | POA: Diagnosis not present

## 2020-05-02 DIAGNOSIS — G4733 Obstructive sleep apnea (adult) (pediatric): Secondary | ICD-10-CM | POA: Insufficient documentation

## 2020-05-02 DIAGNOSIS — S46302A Unspecified injury of muscle, fascia and tendon of triceps, left arm, initial encounter: Secondary | ICD-10-CM | POA: Diagnosis not present

## 2020-05-02 HISTORY — PX: TRICEPS TENDON REPAIR: SHX2577

## 2020-05-02 HISTORY — DX: Personal history of urinary calculi: Z87.442

## 2020-05-02 LAB — BASIC METABOLIC PANEL
Anion gap: 9 (ref 5–15)
BUN: 18 mg/dL (ref 8–23)
CO2: 27 mmol/L (ref 22–32)
Calcium: 9 mg/dL (ref 8.9–10.3)
Chloride: 102 mmol/L (ref 98–111)
Creatinine, Ser: 1.23 mg/dL (ref 0.61–1.24)
GFR calc Af Amer: >60 mL/min (ref >60)
GFR calc non Af Amer: 56 mL/min — ABNORMAL LOW (ref >60)
Glucose, Bld: 102 mg/dL — ABNORMAL HIGH (ref 70–99)
Potassium: 4 mmol/L (ref 3.5–5.1)
Sodium: 138 mmol/L (ref 135–145)

## 2020-05-02 SURGERY — REPAIR, TENDON, TRICEPS
Anesthesia: General | Laterality: Left

## 2020-05-02 MED ORDER — MIDAZOLAM HCL 2 MG/2ML IJ SOLN
INTRAMUSCULAR | Status: AC
  Administered 2020-05-02: 1 mg via INTRAVENOUS
  Filled 2020-05-02: qty 2

## 2020-05-02 MED ORDER — CEFAZOLIN SODIUM-DEXTROSE 2-4 GM/100ML-% IV SOLN
2.0000 g | Freq: Four times a day (QID) | INTRAVENOUS | Status: AC
  Administered 2020-05-02 – 2020-05-03 (×3): 2 g via INTRAVENOUS
  Filled 2020-05-02 (×3): qty 100

## 2020-05-02 MED ORDER — ATORVASTATIN CALCIUM 10 MG PO TABS
20.0000 mg | ORAL_TABLET | Freq: Every evening | ORAL | Status: DC
  Administered 2020-05-02: 20 mg via ORAL
  Filled 2020-05-02: qty 2

## 2020-05-02 MED ORDER — CELECOXIB 200 MG PO CAPS
ORAL_CAPSULE | ORAL | Status: AC
  Administered 2020-05-02: 200 mg via ORAL
  Filled 2020-05-02: qty 1

## 2020-05-02 MED ORDER — ACETAMINOPHEN 500 MG PO TABS
1000.0000 mg | ORAL_TABLET | Freq: Four times a day (QID) | ORAL | Status: DC
  Administered 2020-05-02 – 2020-05-03 (×3): 1000 mg via ORAL
  Filled 2020-05-02 (×2): qty 2

## 2020-05-02 MED ORDER — ACETAMINOPHEN 500 MG PO TABS: 1000.0000 mg | ORAL_TABLET | Freq: Once | ORAL | Status: AC

## 2020-05-02 MED ORDER — LIDOCAINE 2% (20 MG/ML) 5 ML SYRINGE
INTRAMUSCULAR | Status: AC
  Filled 2020-05-02: qty 5

## 2020-05-02 MED ORDER — DEXAMETHASONE SODIUM PHOSPHATE 10 MG/ML IJ SOLN
INTRAMUSCULAR | Status: AC
  Filled 2020-05-02: qty 1

## 2020-05-02 MED ORDER — GLYCOPYRROLATE PF 0.2 MG/ML IJ SOSY
PREFILLED_SYRINGE | INTRAMUSCULAR | Status: AC
  Filled 2020-05-02: qty 1

## 2020-05-02 MED ORDER — 0.9 % SODIUM CHLORIDE (POUR BTL) OPTIME
TOPICAL | Status: DC | PRN
  Administered 2020-05-02: 1000 mL

## 2020-05-02 MED ORDER — CHLORHEXIDINE GLUCONATE 0.12 % MT SOLN
15.0000 mL | Freq: Once | OROMUCOSAL | Status: AC
  Administered 2020-05-02: 15 mL via OROMUCOSAL
  Filled 2020-05-02: qty 15

## 2020-05-02 MED ORDER — ORAL CARE MOUTH RINSE: 15.0000 mL | Freq: Once | OROMUCOSAL | Status: AC

## 2020-05-02 MED ORDER — FENTANYL CITRATE (PF) 250 MCG/5ML IJ SOLN
INTRAMUSCULAR | Status: DC | PRN
  Administered 2020-05-02: 100 ug via INTRAVENOUS

## 2020-05-02 MED ORDER — PHENYLEPHRINE HCL (PRESSORS) 10 MG/ML IV SOLN
INTRAVENOUS | Status: DC | PRN
  Administered 2020-05-02 (×2): 120 ug via INTRAVENOUS
  Administered 2020-05-02 (×2): 80 ug via INTRAVENOUS

## 2020-05-02 MED ORDER — DORZOLAMIDE HCL-TIMOLOL MAL 2-0.5 % OP SOLN
1.0000 [drp] | Freq: Two times a day (BID) | OPHTHALMIC | Status: DC
  Administered 2020-05-02 – 2020-05-03 (×2): 1 [drp] via OPHTHALMIC
  Filled 2020-05-02: qty 10

## 2020-05-02 MED ORDER — PROPOFOL 10 MG/ML IV BOLUS
INTRAVENOUS | Status: AC
  Filled 2020-05-02: qty 20

## 2020-05-02 MED ORDER — IRBESARTAN 75 MG PO TABS
75.0000 mg | ORAL_TABLET | Freq: Every day | ORAL | Status: DC
  Administered 2020-05-02 – 2020-05-03 (×2): 75 mg via ORAL
  Filled 2020-05-02 (×2): qty 1

## 2020-05-02 MED ORDER — BUPIVACAINE-EPINEPHRINE (PF) 0.5% -1:200000 IJ SOLN
INTRAMUSCULAR | Status: DC | PRN
  Administered 2020-05-02: 30 mL via PERINEURAL

## 2020-05-02 MED ORDER — METOCLOPRAMIDE HCL 5 MG PO TABS: 5.0000 mg | ORAL_TABLET | Freq: Three times a day (TID) | ORAL | Status: DC | PRN

## 2020-05-02 MED ORDER — ROCURONIUM BROMIDE 10 MG/ML (PF) SYRINGE
PREFILLED_SYRINGE | INTRAVENOUS | Status: AC
  Filled 2020-05-02: qty 10

## 2020-05-02 MED ORDER — SENNOSIDES-DOCUSATE SODIUM 8.6-50 MG PO TABS: 1.0000 | ORAL_TABLET | Freq: Every evening | ORAL | Status: DC | PRN

## 2020-05-02 MED ORDER — ACETAMINOPHEN 500 MG PO TABS: 1000.0000 mg | ORAL_TABLET | Freq: Four times a day (QID) | ORAL | Status: DC | PRN

## 2020-05-02 MED ORDER — FENTANYL CITRATE (PF) 100 MCG/2ML IJ SOLN
INTRAMUSCULAR | Status: AC
  Administered 2020-05-02: 100 ug via INTRAVENOUS
  Filled 2020-05-02: qty 2

## 2020-05-02 MED ORDER — EPHEDRINE SULFATE-NACL 50-0.9 MG/10ML-% IV SOSY
PREFILLED_SYRINGE | INTRAVENOUS | Status: DC | PRN
  Administered 2020-05-02 (×3): 10 mg via INTRAVENOUS

## 2020-05-02 MED ORDER — SEMAGLUTIDE(0.25 OR 0.5MG/DOS) 2 MG/1.5ML ~~LOC~~ SOPN: 0.2500 mg | PEN_INJECTOR | SUBCUTANEOUS | Status: DC

## 2020-05-02 MED ORDER — LACTATED RINGERS IV SOLN: INTRAVENOUS | Status: DC

## 2020-05-02 MED ORDER — ONDANSETRON HCL 4 MG PO TABS: 4.0000 mg | ORAL_TABLET | Freq: Four times a day (QID) | ORAL | Status: DC | PRN

## 2020-05-02 MED ORDER — PHENYLEPHRINE 40 MCG/ML (10ML) SYRINGE FOR IV PUSH (FOR BLOOD PRESSURE SUPPORT)
PREFILLED_SYRINGE | INTRAVENOUS | Status: AC
  Filled 2020-05-02: qty 10

## 2020-05-02 MED ORDER — MIDAZOLAM HCL 2 MG/2ML IJ SOLN
INTRAMUSCULAR | Status: AC
  Filled 2020-05-02: qty 2

## 2020-05-02 MED ORDER — VENLAFAXINE HCL ER 150 MG PO CP24
150.0000 mg | ORAL_CAPSULE | Freq: Every day | ORAL | Status: DC
  Administered 2020-05-03: 150 mg via ORAL
  Filled 2020-05-02: qty 1

## 2020-05-02 MED ORDER — DOCUSATE SODIUM 100 MG PO CAPS
100.0000 mg | ORAL_CAPSULE | Freq: Two times a day (BID) | ORAL | Status: DC
  Administered 2020-05-02 – 2020-05-03 (×2): 100 mg via ORAL
  Filled 2020-05-02 (×2): qty 1

## 2020-05-02 MED ORDER — OXYCODONE HCL 5 MG PO TABS: 10.0000 mg | ORAL_TABLET | ORAL | Status: DC | PRN

## 2020-05-02 MED ORDER — LATANOPROST 0.005 % OP SOLN
1.0000 [drp] | Freq: Every day | OPHTHALMIC | Status: DC
  Administered 2020-05-02: 1 [drp] via OPHTHALMIC
  Filled 2020-05-02: qty 2.5

## 2020-05-02 MED ORDER — METHOCARBAMOL 500 MG PO TABS: 500.0000 mg | ORAL_TABLET | Freq: Four times a day (QID) | ORAL | Status: DC | PRN

## 2020-05-02 MED ORDER — EPHEDRINE 5 MG/ML INJ
INTRAVENOUS | Status: AC
  Filled 2020-05-02: qty 10

## 2020-05-02 MED ORDER — FENTANYL CITRATE (PF) 100 MCG/2ML IJ SOLN: 25.0000 ug | INTRAMUSCULAR | Status: DC | PRN

## 2020-05-02 MED ORDER — DEXAMETHASONE SODIUM PHOSPHATE 10 MG/ML IJ SOLN
INTRAMUSCULAR | Status: DC | PRN
  Administered 2020-05-02: 10 mg via INTRAVENOUS

## 2020-05-02 MED ORDER — AMLODIPINE BESYLATE 10 MG PO TABS
10.0000 mg | ORAL_TABLET | Freq: Every morning | ORAL | Status: DC
  Administered 2020-05-03: 10 mg via ORAL
  Filled 2020-05-02: qty 1

## 2020-05-02 MED ORDER — PROPOFOL 10 MG/ML IV BOLUS
INTRAVENOUS | Status: DC | PRN
  Administered 2020-05-02: 180 mg via INTRAVENOUS

## 2020-05-02 MED ORDER — FENTANYL CITRATE (PF) 250 MCG/5ML IJ SOLN
INTRAMUSCULAR | Status: AC
  Filled 2020-05-02: qty 5

## 2020-05-02 MED ORDER — MIDAZOLAM HCL 2 MG/2ML IJ SOLN: 1.0000 mg | Freq: Once | INTRAMUSCULAR | Status: AC

## 2020-05-02 MED ORDER — METHOCARBAMOL 1000 MG/10ML IJ SOLN
500.0000 mg | Freq: Four times a day (QID) | INTRAVENOUS | Status: DC | PRN
  Filled 2020-05-02: qty 5

## 2020-05-02 MED ORDER — CEFAZOLIN SODIUM-DEXTROSE 2-4 GM/100ML-% IV SOLN
2.0000 g | INTRAVENOUS | Status: AC
  Administered 2020-05-02: 2 g via INTRAVENOUS
  Filled 2020-05-02: qty 100

## 2020-05-02 MED ORDER — CELECOXIB 200 MG PO CAPS: 200.0000 mg | ORAL_CAPSULE | Freq: Once | ORAL | Status: AC

## 2020-05-02 MED ORDER — TRAMADOL HCL 50 MG PO TABS: 50.0000 mg | ORAL_TABLET | Freq: Every day | ORAL | Status: DC | PRN

## 2020-05-02 MED ORDER — BRIMONIDINE TARTRATE 0.2 % OP SOLN
1.0000 [drp] | Freq: Three times a day (TID) | OPHTHALMIC | Status: DC
  Administered 2020-05-02 – 2020-05-03 (×2): 1 [drp] via OPHTHALMIC
  Filled 2020-05-02: qty 5

## 2020-05-02 MED ORDER — TAMSULOSIN HCL 0.4 MG PO CAPS
0.4000 mg | ORAL_CAPSULE | Freq: Every day | ORAL | Status: DC
  Administered 2020-05-02: 0.4 mg via ORAL
  Filled 2020-05-02: qty 1

## 2020-05-02 MED ORDER — METOCLOPRAMIDE HCL 5 MG/ML IJ SOLN: 5.0000 mg | Freq: Three times a day (TID) | INTRAMUSCULAR | Status: DC | PRN

## 2020-05-02 MED ORDER — ONDANSETRON HCL 4 MG/2ML IJ SOLN
INTRAMUSCULAR | Status: DC | PRN
  Administered 2020-05-02: 4 mg via INTRAVENOUS

## 2020-05-02 MED ORDER — FENTANYL CITRATE (PF) 100 MCG/2ML IJ SOLN: 100.0000 ug | Freq: Once | INTRAMUSCULAR | Status: AC

## 2020-05-02 MED ORDER — ONDANSETRON HCL 4 MG/2ML IJ SOLN
INTRAMUSCULAR | Status: AC
  Filled 2020-05-02: qty 2

## 2020-05-02 MED ORDER — SUGAMMADEX SODIUM 200 MG/2ML IV SOLN
INTRAVENOUS | Status: DC | PRN
  Administered 2020-05-02: 200 mg via INTRAVENOUS

## 2020-05-02 MED ORDER — ACETAMINOPHEN 325 MG PO TABS: 325.0000 mg | ORAL_TABLET | Freq: Four times a day (QID) | ORAL | Status: DC | PRN

## 2020-05-02 MED ORDER — OXYCODONE HCL 5 MG PO TABS: 5.0000 mg | ORAL_TABLET | ORAL | Status: DC | PRN

## 2020-05-02 MED ORDER — SUCCINYLCHOLINE CHLORIDE 200 MG/10ML IV SOSY
PREFILLED_SYRINGE | INTRAVENOUS | Status: AC
  Filled 2020-05-02: qty 10

## 2020-05-02 MED ORDER — CEFAZOLIN SODIUM 1 G IJ SOLR
INTRAMUSCULAR | Status: AC
  Filled 2020-05-02: qty 20

## 2020-05-02 MED ORDER — ONDANSETRON HCL 4 MG/2ML IJ SOLN: 4.0000 mg | Freq: Four times a day (QID) | INTRAMUSCULAR | Status: DC | PRN

## 2020-05-02 MED ORDER — HYDROMORPHONE HCL 1 MG/ML IJ SOLN: 0.5000 mg | INTRAMUSCULAR | Status: DC | PRN

## 2020-05-02 MED ORDER — ACETAMINOPHEN 500 MG PO TABS
ORAL_TABLET | ORAL | Status: AC
  Administered 2020-05-02: 1000 mg via ORAL
  Filled 2020-05-02: qty 2

## 2020-05-02 MED ORDER — ROCURONIUM BROMIDE 10 MG/ML (PF) SYRINGE
PREFILLED_SYRINGE | INTRAVENOUS | Status: DC | PRN
  Administered 2020-05-02: 70 mg via INTRAVENOUS
  Administered 2020-05-02: 20 mg via INTRAVENOUS

## 2020-05-02 SURGICAL SUPPLY — 55 items
BNDG ELASTIC 3X5.8 VLCR STR LF (GAUZE/BANDAGES/DRESSINGS) ×2 IMPLANT
BNDG ELASTIC 4X5.8 VLCR STR LF (GAUZE/BANDAGES/DRESSINGS) ×2 IMPLANT
BNDG GAUZE ELAST 4 BULKY (GAUZE/BANDAGES/DRESSINGS) ×4 IMPLANT
CORD BIPOLAR FORCEPS 12FT (ELECTRODE) ×3 IMPLANT
COVER SURGICAL LIGHT HANDLE (MISCELLANEOUS) ×3 IMPLANT
COVER WAND RF STERILE (DRAPES) ×1 IMPLANT
CUFF TOURN SGL QUICK 18X4 (TOURNIQUET CUFF) ×3 IMPLANT
CUFF TOURN SGL QUICK 24 (TOURNIQUET CUFF) ×3
CUFF TRNQT CYL 24X4X16.5-23 (TOURNIQUET CUFF) IMPLANT
DECANTER SPIKE VIAL GLASS SM (MISCELLANEOUS) ×1 IMPLANT
DRAPE OEC MINIVIEW 54X84 (DRAPES) ×2 IMPLANT
DRAPE SURG 17X23 STRL (DRAPES) ×3 IMPLANT
DRSG ADAPTIC 3X8 NADH LF (GAUZE/BANDAGES/DRESSINGS) ×4 IMPLANT
EVACUATOR 3/16  PVC DRAIN (DRAIN) ×3
EVACUATOR 3/16 PVC DRAIN (DRAIN) IMPLANT
GAUZE SPONGE 4X4 12PLY STRL (GAUZE/BANDAGES/DRESSINGS) ×2 IMPLANT
GAUZE XEROFORM 1X8 LF (GAUZE/BANDAGES/DRESSINGS) ×2 IMPLANT
GAUZE XEROFORM 5X9 LF (GAUZE/BANDAGES/DRESSINGS) ×4 IMPLANT
GLOVE BIOGEL M 8.0 STRL (GLOVE) ×5 IMPLANT
GLOVE SS BIOGEL STRL SZ 8 (GLOVE) ×1 IMPLANT
GLOVE SUPERSENSE BIOGEL SZ 8 (GLOVE) ×4
GOWN STRL REUS W/ TWL LRG LVL3 (GOWN DISPOSABLE) ×2 IMPLANT
GOWN STRL REUS W/ TWL XL LVL3 (GOWN DISPOSABLE) ×3 IMPLANT
GOWN STRL REUS W/TWL LRG LVL3 (GOWN DISPOSABLE) ×6
GOWN STRL REUS W/TWL XL LVL3 (GOWN DISPOSABLE) ×9
IMP SYS 2ND FIX PEEK 4.75X19.1 (Miscellaneous) ×3 IMPLANT
IMPL SYS 2ND FX PEEK 4.75X19.1 (Miscellaneous) IMPLANT
KIT BASIN OR (CUSTOM PROCEDURE TRAY) ×3 IMPLANT
KIT TURNOVER KIT B (KITS) ×3 IMPLANT
MANIFOLD NEPTUNE II (INSTRUMENTS) ×1 IMPLANT
NDL HYPO 25GX1X1/2 BEV (NEEDLE) IMPLANT
NDL KEITH (NEEDLE) ×1 IMPLANT
NEEDLE HYPO 25GX1X1/2 BEV (NEEDLE) IMPLANT
NEEDLE KEITH (NEEDLE) ×3 IMPLANT
NS IRRIG 1000ML POUR BTL (IV SOLUTION) ×3 IMPLANT
PACK ORTHO EXTREMITY (CUSTOM PROCEDURE TRAY) ×3 IMPLANT
PAD ARMBOARD 7.5X6 YLW CONV (MISCELLANEOUS) ×6 IMPLANT
PAD CAST 4YDX4 CTTN HI CHSV (CAST SUPPLIES) IMPLANT
PADDING CAST COTTON 4X4 STRL (CAST SUPPLIES) ×3
PASSER SUT SWANSON 36MM LOOP (INSTRUMENTS) IMPLANT
RETRIEVER SUT HEWSON (MISCELLANEOUS) ×4 IMPLANT
SOL PREP POV-IOD 4OZ 10% (MISCELLANEOUS) ×9 IMPLANT
SUT FIBERWIRE #2 38 T-5 BLUE (SUTURE) ×3
SUT PROLENE 3 0 PS 1 (SUTURE) ×8 IMPLANT
SUT PROLENE 4 0 PS 2 18 (SUTURE) IMPLANT
SUT VIC AB 2-0 CT1 27 (SUTURE)
SUT VIC AB 2-0 CT1 TAPERPNT 27 (SUTURE) IMPLANT
SUTURE FIBERWR #2 38 T-5 BLUE (SUTURE) IMPLANT
SYR CONTROL 10ML LL (SYRINGE) IMPLANT
TOWEL GREEN STERILE (TOWEL DISPOSABLE) ×3 IMPLANT
TOWEL GREEN STERILE FF (TOWEL DISPOSABLE) ×3 IMPLANT
TUBE CONNECTING 12'X1/4 (SUCTIONS) ×1
TUBE CONNECTING 12X1/4 (SUCTIONS) ×1 IMPLANT
UNDERPAD 30X36 HEAVY ABSORB (UNDERPADS AND DIAPERS) ×3 IMPLANT
WATER STERILE IRR 1000ML POUR (IV SOLUTION) ×1 IMPLANT

## 2020-05-02 NOTE — Progress Notes (Signed)
Patient ID: Tommy [name], male   DOB: 08-07-1941, 79 y.o.   MRN: [id number] Patient seen and examined in the postop attended to.  I discussed all issues with his wife.  Tommy [name]

## 2020-05-02 NOTE — Anesthesia Procedure Notes (Signed)
Procedure Name: Intubation Date/Time: 05/02/2020 4:40 PM Performed by: Babs Bertin, CRNA Pre-anesthesia Checklist: Patient identified, Emergency Drugs available, Suction available and Patient being monitored Patient Re-evaluated:Patient Re-evaluated prior to induction Oxygen Delivery Method: Circle System Utilized Preoxygenation: Pre-oxygenation with 100% oxygen Induction Type: IV induction Ventilation: Mask ventilation with difficulty, Two handed mask ventilation required and Oral airway inserted - appropriate to patient size Laryngoscope Size: Mac and 3 Grade View: Grade I Tube type: Oral Tube size: 7.0 mm Number of attempts: 1 Airway Equipment and Method: Stylet and Oral airway Placement Confirmation: ETT inserted through vocal cords under direct vision,  positive ETCO2 and breath sounds checked- equal and bilateral Secured at: 22 cm Tube secured with: Tape Dental Injury: Teeth and Oropharynx as per pre-operative assessment

## 2020-05-02 NOTE — Anesthesia Procedure Notes (Signed)
Anesthesia Regional Block: Supraclavicular block   Pre-Anesthetic Checklist: ,, timeout performed, Correct Patient, Correct Site, Correct Laterality, Correct Procedure, Correct Position, site marked, Risks and benefits discussed, pre-op evaluation,  At surgeon's request and post-op pain management  Laterality: Left  Prep: Maximum Sterile Barrier Precautions used, chloraprep       Needles:  Injection technique: Single-shot  Needle Type: Echogenic Stimulator Needle     Needle Length: 9cm  Needle Gauge: 22     Additional Needles:   Procedures:,,,, ultrasound used (permanent image in chart),,,,  Narrative:  Start time: 05/02/2020 1:18 PM End time: 05/02/2020 1:28 PM Injection made incrementally with aspirations every 5 mL. Anesthesiologist: Roderic Palau, MD  Additional Notes: 2% Lidocaine skin wheel.

## 2020-05-02 NOTE — Op Note (Signed)
Operative note 05/02/2020  Roseanne Kaufman MD.  Preoperative diagnosis left triceps tendon rupture at the olecranon with associated bursitis and ulnar nerve irritability secondary to the hematoma and traumatic events.  Postop diagnosis the same  Operative procedure #1 open bursectomy extensive in nature left elbow #2 triceps repair with transosseous drill hole technique from Arthrex this was a distal triceps repair reconstruction #3 ulnar nerve decompression in situ  Surgeon Roseanne Kaufman  Tourniquet time less than an hour  Drains 1  Anesthesia General  Description of procedure in detail: Patient was seen by myself and anesthesia taken to the operative theater and underwent smooth induction of general anesthesia.  He was carefully padded with axillary protection and SCDs were applied.  All body parts were well-padded beanbag was inflated in a sloppy lateral position.  Following this I performed Hibiclens prescrub x2 followed by 10-minute surgical Betadine scrub and paint which I performed.  Following this sterile draping ensued followed by timeout being observed.  The operation commenced with elevation of sterile tourniquet 250 mmHg.  An extensile posterior incision was made dissection was carried down and patient underwent very careful and cautious approach to the extremity with removal of the bursectomy a large amount of bursa fluid was removed not infectious in nature following the circumferential bursectomy was accomplished.  This was a very large bursectomy and there was a lot of inflammatory fluid present.  I resected the entire bursa sac.  Following this it was very apparent the triceps was highly injured there was a lot of clot and hematoma.  I debrided this area and then identified the ulnar nerve and performed a ulnar nerve decompression.  The ulnar nerve was decompressed about the arcade of Struthers, medial intermuscular septum, 2 heads of the FCU, Osborne's ligament and the  cubital tunnel itself.  The nerve is very carefully protected.  It was surrounded inflammatory fluid in this necessitated in situ decompression.  Also, with the triceps and the scar tissue and early adhesions I felt the nerve would highly be jeopardized with excessive mobilization of the triceps thus we freed the nerve nicely.  At this time I then turned attention towards the triceps.  I made a modified Krakw stitch with 4 exiting limbs 1-1/2 cm off of the distal tip of the triceps which was freshened and mobilize.  Following this I placed a fiber link shuttle and then prepared the olecranon.  The olecranon was prepared with combination rondure and bony drill holes were made.  2 exiting drill holes were made.  Sutures were then placed through the bony drill holes and following this 2 limbs placed back through the fiber link on both the medial and lateral sides.  This allowed [place] cinched down motion and a tight fit through the tunnels.  I then used for 5 Arthrex swivel lock for harness in the sutures.  The patient tolerated this nicely and there were no complications or problems.  This was done in standard technique.  Triceps repair was stat looked excellent and had no tension on it with the flexion to 45 degrees.  Given the timeframe duration from injury the patient will likely have a slow course of early rehab but I do feel he can have a nice recovery.  As the triceps is so important for gentleman at his age in terms of getting out of chairs and performing activities I do feel that he needs a good strong hearty triceps.  The wound was irrigated copiously tourniquet deflated and Hemovac  drain placed.  Following this I checked all areas including the nerve once again which looked excellent.  I was pleased with this and the findings.  Following this we then performed very careful and cautious approach to the closure which was performed with 3-0 Prolene.  Long-arm splint was applied after sterile dressing  placed.  Augmentin for IV antibiotics pain management and DC tomorrow if he is appropriate for DC.  He tolerated the procedure well and was awake alert and oriented in the recovery room.  Roseanne Kaufman MD

## 2020-05-02 NOTE — Anesthesia Preprocedure Evaluation (Addendum)
Anesthesia Evaluation  Patient identified by MRN, date of birth, ID band Patient awake    Reviewed: Allergy & Precautions, H&P , NPO status , Patient's Chart, lab work & pertinent test results  Airway Mallampati: III  TM Distance: >3 FB Neck ROM: Full    Dental no notable dental hx. (+) Teeth Intact, Dental Advisory Given   Pulmonary sleep apnea and Continuous Positive Airway Pressure Ventilation , former smoker,    Pulmonary exam normal breath sounds clear to auscultation       Cardiovascular hypertension, Pt. on medications  Rhythm:Regular Rate:Normal     Neuro/Psych negative neurological ROS  negative psych ROS   GI/Hepatic negative GI ROS, Neg liver ROS,   Endo/Other  Morbid obesity  Renal/GU negative Renal ROS  negative genitourinary   Musculoskeletal  (+) Arthritis , Osteoarthritis,    Abdominal   Peds  Hematology negative hematology ROS (+)   Anesthesia Other Findings   Reproductive/Obstetrics negative OB ROS                            Anesthesia Physical Anesthesia Plan  ASA: III  Anesthesia Plan: General   Post-op Pain Management:  Regional for Post-op pain   Induction: Intravenous  PONV Risk Score and Plan: 3 and Ondansetron, Dexamethasone and Midazolam  Airway Management Planned: Oral ETT  Additional Equipment:   Intra-op Plan:   Post-operative Plan: Extubation in OR  Informed Consent: I have reviewed the patients History and Physical, chart, labs and discussed the procedure including the risks, benefits and alternatives for the proposed anesthesia with the patient or authorized representative who has indicated his/her understanding and acceptance.     Dental advisory given  Plan Discussed with: CRNA  Anesthesia Plan Comments:         Anesthesia Quick Evaluation

## 2020-05-02 NOTE — H&P (Signed)
Tommy [name] is an 80 y.o. male.   Chief Complaint: Left elbow triceps tendon rupture with bursitis and significant swelling.  Rupture is complete. HPI: Patient presents for surgical reconstruction left elbow I discussed with him all issues plans concerns.  We will plan for bursectomy triceps repair and ulnar nerve decompression with repair reconstruction is necessary.  He denies other injury today.  I discussed all issues with he and his family and will proceed accordingly.  Past Medical History:  Diagnosis Date  . Arthritis   . History of kidney stones   . Hyperlipemia   . Hypertension   . Hypoxemia 09/02/2014  . OSA on CPAP   . PLMD (periodic limb movement disorder) 09/02/2014  . Seasonal allergies   . Sleep apnea    uses a cpap    Past Surgical History:  Procedure Laterality Date  . BUNIONECTOMY Right 01/03/2014   Procedure: RIGHT LAPIDUS BUNION CORRECTION;  Surgeon: Wylene Simmer, MD;  Location: [facility];  Service: Orthopedics;  Laterality: Right;  . JOINT REPLACEMENT     bilateral knee replacemts  . JOINT REPLACEMENT     bilateral shoulder replacemts  . KNEE ARTHROSCOPY     right and left  . SHOULDER ARTHROSCOPY  2011   right  . TOTAL KNEE ARTHROPLASTY  2005   right  . TOTAL KNEE ARTHROPLASTY  2004   left  . TOTAL SHOULDER ARTHROPLASTY  2012   left    Family History  Problem Relation Age of Onset  . Glaucoma Father   . Alzheimer's disease Mother   . Dementia Mother   . Heart Problems Maternal Grandfather   . Heart Problems Maternal Grandmother    Social History:  reports that he quit smoking about 37 years ago. He has never used smokeless tobacco. He reports that he does not drink alcohol or use drugs.  Allergies: No Known Allergies  Medications Prior to Admission  Medication Sig Dispense Refill  . acetaminophen (TYLENOL) 500 MG tablet Take 1,000 mg by mouth every 6 (six) hours as needed for moderate pain or headache.    Marland Kitchen amLODipine  (NORVASC) 10 MG tablet Take 10 mg by mouth every morning.    Marland Kitchen atorvastatin (LIPITOR) 20 MG tablet Take 20 mg by mouth every evening.    . brimonidine (ALPHAGAN) 0.2 % ophthalmic solution Place 1 drop into the left eye 3 (three) times daily.   11  . buPROPion (WELLBUTRIN) 75 MG tablet Take 75 mg by mouth daily.    . Cholecalciferol (DIALYVITE VITAMIN D 5000) 125 MCG (5000 UT) capsule Take 5,000 Units by mouth daily.    . dorzolamide-timolol (COSOPT) 22.3-6.8 MG/ML ophthalmic solution Place 1 drop into the left eye 2 (two) times daily.     . fluticasone (FLONASE) 50 MCG/ACT nasal spray Place 2 sprays into the nose daily as needed for allergies.     Marland Kitchen latanoprost (XALATAN) 0.005 % ophthalmic solution Place 1 drop into the left eye at bedtime.    . naproxen sodium (ALEVE) 220 MG tablet Take 220 mg by mouth at bedtime as needed (pain).    . nystatin-triamcinolone (MYCOLOG II) cream Apply 1 application topically daily as needed (rash).     . Semaglutide,0.25 or 0.5MG /DOS, (OZEMPIC, 0.25 OR 0.5 MG/DOSE,) 2 MG/1.5ML SOPN Inject 0.25-0.5 mg into the skin every Thursday.    . tamsulosin (FLOMAX) 0.4 MG CAPS capsule Take 0.4 mg by mouth at bedtime.     Marland Kitchen telmisartan (MICARDIS) 80 MG tablet Take  80 mg by mouth at bedtime.     . traMADol (ULTRAM) 50 MG tablet Take 50 mg by mouth daily as needed for moderate pain.     Marland Kitchen venlafaxine XR (EFFEXOR-XR) 75 MG 24 hr capsule Take 150 mg by mouth daily.     . vitamin E 400 UNIT capsule Take 400 Units daily by mouth.    . zinc gluconate 50 MG tablet Take 50 mg by mouth daily.    Marland Kitchen aspirin EC 81 MG tablet Take 81 mg by mouth daily.    . Multiple Vitamin (MULTIVITAMIN) tablet Take 1 tablet daily by mouth.      Results for orders placed or performed during the hospital encounter of 05/02/20 (from the past 48 hour(s))  Basic metabolic panel     Status: Abnormal   Collection Time: 05/02/20 12:25 PM  Result Value Ref Range   Sodium 138 135 - 145 mmol/L   Potassium 4.0  3.5 - 5.1 mmol/L   Chloride 102 98 - 111 mmol/L   CO2 27 22 - 32 mmol/L   Glucose, Bld 102 (H) 70 - 99 mg/dL    Comment: Glucose reference range applies only to samples taken after fasting for at least 8 hours.   BUN 18 8 - 23 mg/dL   Creatinine, Ser 1.23 0.61 - 1.24 mg/dL   Calcium 9.0 8.9 - 10.3 mg/dL   GFR calc non Af Amer 56 (L) >60 mL/min   GFR calc Af Amer >60 >60 mL/min   Anion gap 9 5 - 15    Comment: Performed at [facility] [address], [place], [place] [address]   DG MINI C-ARM IMAGE ONLY  Result Date: 05/02/2020 There is no interpretation for this exam.  This order is for images obtained during a surgical procedure.  Please See "Surgeries" Tab for more information regarding the procedure.    Review of Systems  Gastrointestinal: Negative.   Endocrine: Negative.   Genitourinary: Negative.   Musculoskeletal: Negative.     Blood pressure (!) 147/97, pulse 79, temperature 97.7 F (36.5 C), temperature source Tympanic, resp. rate (!) 25, height 5\' 6"  (1.676 m), weight 114.3 kg, SpO2 100 %. Physical Exam  Complete triceps rupture verified on MRI and clinical exam with ecchymosis swelling and significant bursitis swelling.  I reviewed this with the patient at length and the findings.  At present time we will plan for surgical reconstruction.  He is intact vascular exam and has intact neuro function.  He is not short of breath.  He has a history of CPAP use.  The patient is alert and oriented in no acute distress. The patient complains of pain in the affected upper extremity.  The patient is noted to have a normal HEENT exam. Lung fields show equal chest expansion and no shortness of breath. Abdomen exam is nontender without distention. Lower extremity examination does not show any fracture dislocation or blood clot symptoms. Pelvis is stable and the neck and back are stable and nontender. Assessment/Plan We will plan for open repair left triceps with  bursectomy and ulnar nerve release with repair as necessary.  All questions have been addressed.We are planning surgery for your upper extremity. The risk and benefits of surgery to include risk of bleeding, infection, anesthesia,  damage to normal structures and failure of the surgery to accomplish its intended goals of relieving symptoms and restoring function have been discussed in detail. With this in mind we plan to proceed. I have specifically  discussed with the patient the pre-and postoperative regime and the dos and don'ts and risk and benefits in great detail. Risk and benefits of surgery also include risk of dystrophy(CRPS), chronic nerve pain, failure of the healing process to go onto completion and other inherent risks of surgery The relavent the pathophysiology of the disease/injury process, as well as the alternatives for treatment and postoperative course of action has been discussed in great detail with the patient who desires to proceed.  We will do everything in our power to help you (the patient) restore function to the upper extremity. It is a pleasure to see this patient today.   Willa Frater III, MD 05/02/2020, 3:50 PM

## 2020-05-02 NOTE — Anesthesia Postprocedure Evaluation (Signed)
Anesthesia Post Note  Patient: Tommy [name]  Procedure(s) Performed: Left triceps tendon repair reconstruction as necessary with bursectomy and ulnar nerve release at elbow (Left )     Patient location during evaluation: PACU Anesthesia Type: General and Regional Level of consciousness: awake and alert Pain management: pain level controlled Vital Signs Assessment: post-procedure vital signs reviewed and stable Respiratory status: spontaneous breathing, nonlabored ventilation, respiratory function stable and patient connected to nasal cannula oxygen Cardiovascular status: blood pressure returned to baseline and stable Postop Assessment: no apparent nausea or vomiting Anesthetic complications: no Comments: Awake and alert, O2 Sats 85-88% on RA, decision made to admit patient overnight, CPAP ordered.    Last Vitals:  Vitals:   05/02/20 1945 05/02/20 2023  BP: (!) 152/88 (!) 124/91  Pulse: 85 88  Resp: 19 16  Temp:  37.1 C  SpO2:  100%    Last Pain:  Vitals:   05/02/20 1915  TempSrc:   PainSc: 0-No pain                 Joniece Smotherman COKER

## 2020-05-02 NOTE — Transfer of Care (Signed)
Immediate Anesthesia Transfer of Care Note  Patient: Tommy [name]  Procedure(s) Performed: Left triceps tendon repair reconstruction as necessary with bursectomy and ulnar nerve release at elbow (Left )  Patient Location: PACU  Anesthesia Type:General and Regional  Level of Consciousness: awake, alert  and oriented  Airway & Oxygen Therapy: Patient Spontanous Breathing  Post-op Assessment: Report given to RN and Post -op Vital signs reviewed and stable  Post vital signs: Reviewed and stable  Last Vitals:  Vitals Value Taken Time  BP    Temp    Pulse    Resp    SpO2      Last Pain:  Vitals:   05/02/20 1345  TempSrc:   PainSc: 0-No pain         Complications: No apparent anesthesia complications

## 2020-05-02 NOTE — Progress Notes (Signed)
Pt refusing CPAP at this time. States he is only staying for one night, so he should be good with just O2. Advised pt to notify for RT if he changes his mind.

## 2020-05-03 DIAGNOSIS — M1990 Unspecified osteoarthritis, unspecified site: Secondary | ICD-10-CM | POA: Diagnosis not present

## 2020-05-03 DIAGNOSIS — S4402XA Injury of ulnar nerve at upper arm level, left arm, initial encounter: Secondary | ICD-10-CM | POA: Diagnosis not present

## 2020-05-03 DIAGNOSIS — S5012XA Contusion of left forearm, initial encounter: Secondary | ICD-10-CM | POA: Diagnosis not present

## 2020-05-03 DIAGNOSIS — E785 Hyperlipidemia, unspecified: Secondary | ICD-10-CM | POA: Diagnosis not present

## 2020-05-03 DIAGNOSIS — I10 Essential (primary) hypertension: Secondary | ICD-10-CM | POA: Diagnosis not present

## 2020-05-03 DIAGNOSIS — Z7982 Long term (current) use of aspirin: Secondary | ICD-10-CM | POA: Diagnosis not present

## 2020-05-03 DIAGNOSIS — G4733 Obstructive sleep apnea (adult) (pediatric): Secondary | ICD-10-CM | POA: Diagnosis not present

## 2020-05-03 DIAGNOSIS — M719 Bursopathy, unspecified: Secondary | ICD-10-CM | POA: Diagnosis not present

## 2020-05-03 DIAGNOSIS — R2689 Other abnormalities of gait and mobility: Secondary | ICD-10-CM | POA: Diagnosis not present

## 2020-05-03 DIAGNOSIS — S46312A Strain of muscle, fascia and tendon of triceps, left arm, initial encounter: Secondary | ICD-10-CM | POA: Diagnosis not present

## 2020-05-03 MED ORDER — CEPHALEXIN 500 MG PO CAPS: 500.0000 mg | ORAL_CAPSULE | Freq: Four times a day (QID) | ORAL | 0 refills | Status: AC

## 2020-05-03 MED ORDER — METHOCARBAMOL 500 MG PO TABS: 500.0000 mg | ORAL_TABLET | Freq: Three times a day (TID) | ORAL | 0 refills | Status: AC | PRN

## 2020-05-03 MED ORDER — OXYCODONE HCL 5 MG PO TABS: 10.0000 mg | ORAL_TABLET | ORAL | 0 refills | Status: DC | PRN

## 2020-05-03 NOTE — Progress Notes (Signed)
OT Cancellation Note  Patient Details Name: Tommy [name] MRN: [id number] DOB: 1941-11-21   Cancelled Treatment:    Reason Eval/Treat Not Completed: Other (comment). Pt getting breakfast set up to eat, will return this AM for eval with patient and wife (she will be arriving in ~30 minutes per pt).  [place], OTR/L Acute Rehab Services Pager [phone] Office [phone]     Almon Register 05/03/2020, 8:26 AM

## 2020-05-03 NOTE — Progress Notes (Signed)
PT Cancellation Note  Patient Details Name: Tommy [name] MRN: [id number] DOB: September 06, 1941   Cancelled Treatment:    Reason Eval/Treat Not Completed: PT screened, no needs identified, will sign off     Wyona Almas, PT, DPT Acute Rehabilitation Services Pager [phone] Office [phone]    Deno Etienne 05/03/2020, 10:26 AM

## 2020-05-03 NOTE — Plan of Care (Signed)
Problem: Education: Goal: Knowledge of General Education information will improve Description: Including pain rating scale, medication(s)/side effects and non-pharmacologic comfort measures 05/03/2020 1136 by Baldomero Lamy, RN Outcome: Adequate for Discharge 05/03/2020 [id number] by Baldomero Lamy, RN Outcome: Progressing   Problem: Health Behavior/Discharge Planning: Goal: Ability to manage health-related needs will improve 05/03/2020 1136 by Baldomero Lamy, RN Outcome: Adequate for Discharge 05/03/2020 [id number] by Baldomero Lamy, RN Outcome: Progressing   Problem: Clinical Measurements: Goal: Ability to maintain clinical measurements within normal limits will improve 05/03/2020 1136 by Baldomero Lamy, RN Outcome: Adequate for Discharge 05/03/2020 [id number] by Baldomero Lamy, RN Outcome: Progressing Goal: Will remain free from infection 05/03/2020 1136 by Baldomero Lamy, RN Outcome: Adequate for Discharge 05/03/2020 [id number] by Baldomero Lamy, RN Outcome: Progressing Goal: Diagnostic test results will improve 05/03/2020 1136 by Baldomero Lamy, RN Outcome: Adequate for Discharge 05/03/2020 [id number] by Baldomero Lamy, RN Outcome: Progressing Goal: Respiratory complications will improve 05/03/2020 1136 by Baldomero Lamy, RN Outcome: Adequate for Discharge 05/03/2020 [id number] by Baldomero Lamy, RN Outcome: Progressing Goal: Cardiovascular complication will be avoided 05/03/2020 1136 by Baldomero Lamy, RN Outcome: Adequate for Discharge 05/03/2020 [id number] by Baldomero Lamy, RN Outcome: Progressing   Problem: Activity: Goal: Risk for activity intolerance will decrease 05/03/2020 1136 by Baldomero Lamy, RN Outcome: Adequate for Discharge 05/03/2020 [id number] by Baldomero Lamy, RN Outcome: Progressing   Problem: Nutrition: Goal: Adequate nutrition will be maintained 05/03/2020 1136 by Baldomero Lamy, RN Outcome: Adequate for Discharge 05/03/2020 [id number] by Baldomero Lamy, RN Outcome: Progressing   Problem: Coping: Goal: Level of anxiety  will decrease 05/03/2020 1136 by Baldomero Lamy, RN Outcome: Adequate for Discharge 05/03/2020 [id number] by Baldomero Lamy, RN Outcome: Progressing   Problem: Elimination: Goal: Will not experience complications related to bowel motility 05/03/2020 1136 by Baldomero Lamy, RN Outcome: Adequate for Discharge 05/03/2020 [id number] by Baldomero Lamy, RN Outcome: Progressing Goal: Will not experience complications related to urinary retention 05/03/2020 1136 by Baldomero Lamy, RN Outcome: Adequate for Discharge 05/03/2020 [id number] by Baldomero Lamy, RN Outcome: Progressing   Problem: Pain Managment: Goal: General experience of comfort will improve 05/03/2020 1136 by Baldomero Lamy, RN Outcome: Adequate for Discharge 05/03/2020 [id number] by Baldomero Lamy, RN Outcome: Progressing   Problem: Safety: Goal: Ability to remain free from injury will improve 05/03/2020 1136 by Baldomero Lamy, RN Outcome: Adequate for Discharge 05/03/2020 [id number] by Baldomero Lamy, RN Outcome: Progressing   Problem: Skin Integrity: Goal: Risk for impaired skin integrity will decrease 05/03/2020 1136 by Baldomero Lamy, RN Outcome: Adequate for Discharge 05/03/2020 [id number] by Baldomero Lamy, RN Outcome: Progressing   Problem: Education: Goal: Knowledge of General Education information will improve Description: Including pain rating scale, medication(s)/side effects and non-pharmacologic comfort measures 05/03/2020 1136 by Baldomero Lamy, RN Outcome: Adequate for Discharge 05/03/2020 [id number] by Baldomero Lamy, RN Outcome: Progressing   Problem: Health Behavior/Discharge Planning: Goal: Ability to manage health-related needs will improve 05/03/2020 1136 by Baldomero Lamy, RN Outcome: Adequate for Discharge 05/03/2020 [id number] by Baldomero Lamy, RN Outcome: Progressing   Problem: Clinical Measurements: Goal: Ability to maintain clinical measurements within normal limits will improve 05/03/2020 1136 by Baldomero Lamy, RN Outcome: Adequate for  Discharge 05/03/2020 [id number] by Baldomero Lamy, RN Outcome: Progressing Goal: Will remain free from infection 05/03/2020 1136 by Baldomero Lamy, RN Outcome: Adequate for Discharge 05/03/2020 [phone] by  Baldomero Lamy, RN Outcome: Progressing Goal: Diagnostic test results will improve 05/03/2020 1136 by Baldomero Lamy, RN Outcome: Adequate for Discharge 05/03/2020 [id number] by Baldomero Lamy, RN Outcome: Progressing Goal: Respiratory complications will improve 05/03/2020 1136 by Baldomero Lamy, RN Outcome: Adequate for Discharge 05/03/2020 [id number] by Baldomero Lamy, RN Outcome: Progressing Goal: Cardiovascular complication will be avoided 05/03/2020 1136 by Baldomero Lamy, RN Outcome: Adequate for Discharge 05/03/2020 [id number] by Baldomero Lamy, RN Outcome: Progressing   Problem: Activity: Goal: Risk for activity intolerance will decrease 05/03/2020 1136 by Baldomero Lamy, RN Outcome: Adequate for Discharge 05/03/2020 [id number] by Baldomero Lamy, RN Outcome: Progressing   Problem: Nutrition: Goal: Adequate nutrition will be maintained 05/03/2020 1136 by Baldomero Lamy, RN Outcome: Adequate for Discharge 05/03/2020 [id number] by Baldomero Lamy, RN Outcome: Progressing   Problem: Coping: Goal: Level of anxiety will decrease 05/03/2020 1136 by Baldomero Lamy, RN Outcome: Adequate for Discharge 05/03/2020 [id number] by Baldomero Lamy, RN Outcome: Progressing   Problem: Elimination: Goal: Will not experience complications related to bowel motility 05/03/2020 1136 by Baldomero Lamy, RN Outcome: Adequate for Discharge 05/03/2020 [id number] by Baldomero Lamy, RN Outcome: Progressing Goal: Will not experience complications related to urinary retention 05/03/2020 1136 by Baldomero Lamy, RN Outcome: Adequate for Discharge 05/03/2020 [id number] by Baldomero Lamy, RN Outcome: Progressing   Problem: Pain Managment: Goal: General experience of comfort will improve 05/03/2020 1136 by Baldomero Lamy, RN Outcome: Adequate for Discharge 05/03/2020 [id number] by Baldomero Lamy, RN Outcome: Progressing   Problem: Safety: Goal: Ability to remain free from injury will improve 05/03/2020 1136 by Baldomero Lamy, RN Outcome: Adequate for Discharge 05/03/2020 [id number] by Baldomero Lamy, RN Outcome: Progressing   Problem: Skin Integrity: Goal: Risk for impaired skin integrity will decrease 05/03/2020 1136 by Baldomero Lamy, RN Outcome: Adequate for Discharge 05/03/2020 [id number] by Baldomero Lamy, RN Outcome: Progressing

## 2020-05-03 NOTE — Plan of Care (Signed)

## 2020-05-03 NOTE — Discharge Summary (Signed)
Physician Discharge Summary  Patient ID: Tommy [name] MRN: [id number] DOB/AGE: 08-06-1941 79 y.o.  Admit date: 05/02/2020 Discharge date:   Admission Diagnoses: Left triceps rupture Past Medical History:  Diagnosis Date  . Arthritis   . History of kidney stones   . Hyperlipemia   . Hypertension   . Hypoxemia 09/02/2014  . OSA on CPAP   . PLMD (periodic limb movement disorder) 09/02/2014  . Seasonal allergies   . Sleep apnea    uses a cpap    Discharge Diagnoses:  Active Problems:   Rupture of left triceps tendon   Surgeries: Procedure(s): Left triceps tendon repair reconstruction as necessary with bursectomy and ulnar nerve release at elbow on 05/02/2020    Consultants:   Discharged Condition: Improved  Hospital Course: Tommy [name] is an 79 y.o. male who was admitted 05/02/2020 with a chief complaint of No chief complaint on file. , and found to have a diagnosis of Left triceps rupture.  They were brought to the operating room on 05/02/2020 and underwent Procedure(s): Left triceps tendon repair reconstruction as necessary with bursectomy and ulnar nerve release at elbow.    They were given perioperative antibiotics:  Anti-infectives (From admission, onward)   Start     Dose/Rate Route Frequency Ordered Stop   05/03/20 0600  ceFAZolin (ANCEF) IVPB 2g/100 mL premix     2 g 200 mL/hr over 30 Minutes Intravenous On call to O.R. 05/02/20 1215 05/02/20 1715   05/03/20 0000  cephALEXin (KEFLEX) 500 MG capsule     500 mg Oral 4 times daily 05/03/20 0909 05/10/20 2359   05/02/20 2300  ceFAZolin (ANCEF) IVPB 2g/100 mL premix     2 g 200 mL/hr over 30 Minutes Intravenous Every 6 hours 05/02/20 2033 05/03/20 1659    .  They were given sequential compression devices, early ambulation, and Other (comment) for DVT prophylaxis.  Recent vital signs:  Patient Vitals for the past 24 hrs:  BP Temp Temp src Pulse Resp SpO2 Height Weight  05/03/20 0757 (!) 160/89 98 F (36.7 C)  Oral 90 19 92 % -- --  05/03/20 0401 (!) 144/81 98.4 F (36.9 C) Oral 92 16 96 % -- --  05/02/20 2023 (!) 124/91 98.8 F (37.1 C) -- 88 16 100 % -- --  05/02/20 1945 (!) 152/88 -- -- 85 19 -- -- --  05/02/20 1940 (!) 170/97 97.8 F (36.6 C) -- 80 16 -- -- --  05/02/20 1915 125/88 98 F (36.7 C) -- 82 18 97 % -- --  05/02/20 1900 121/86 -- -- 83 15 95 % -- --  05/02/20 1852 -- -- -- 81 19 (!) 88 % -- --  05/02/20 1840 (!) 143/79 -- -- 74 19 91 % -- --  05/02/20 1825 127/86 98.2 F (36.8 C) -- 82 12 90 % -- --  05/02/20 1400 (!) 147/97 -- -- 79 (!) 25 100 % -- --  05/02/20 1345 130/88 -- -- 78 13 100 % -- --  05/02/20 1340 (!) 138/94 -- -- 75 14 99 % -- --  05/02/20 1335 (!) 129/95 -- -- 75 14 100 % -- --  05/02/20 1330 (!) 140/91 -- -- 75 (!) 9 99 % -- --  05/02/20 1325 (!) 160/96 -- -- 75 (!) 21 100 % -- --  05/02/20 1320 (!) 162/108 -- -- 74 (!) 21 100 % -- --  05/02/20 1225 (!) 144/92 97.7 F (36.5 C) Tympanic 84 19 99 % 5\' 6"  (  1.676 m) 114.3 kg  .  Recent laboratory studies: DG MINI C-ARM IMAGE ONLY  Result Date: 05/02/2020 There is no interpretation for this exam.  This order is for images obtained during a surgical procedure.  Please See "Surgeries" Tab for more information regarding the procedure.    Discharge Medications:   Allergies as of 05/03/2020   No Known Allergies     Medication List    TAKE these medications   acetaminophen 500 MG tablet Commonly known as: TYLENOL Take 1,000 mg by mouth every 6 (six) hours as needed for moderate pain or headache.   amLODipine 10 MG tablet Commonly known as: NORVASC Take 10 mg by mouth every morning.   aspirin EC 81 MG tablet Take 81 mg by mouth daily.   atorvastatin 20 MG tablet Commonly known as: LIPITOR Take 20 mg by mouth every evening.   brimonidine 0.2 % ophthalmic solution Commonly known as: ALPHAGAN Place 1 drop into the left eye 3 (three) times daily.   buPROPion 75 MG tablet Commonly known as:  WELLBUTRIN Take 75 mg by mouth daily.   cephALEXin 500 MG capsule Commonly known as: KEFLEX Take 1 capsule (500 mg total) by mouth 4 (four) times daily for 7 days.   Dialyvite Vitamin D 5000 125 MCG (5000 UT) capsule Generic drug: Cholecalciferol Take 5,000 Units by mouth daily.   dorzolamide-timolol 22.3-6.8 MG/ML ophthalmic solution Commonly known as: COSOPT Place 1 drop into the left eye 2 (two) times daily.   fluticasone 50 MCG/ACT nasal spray Commonly known as: FLONASE Place 2 sprays into the nose daily as needed for allergies.   latanoprost 0.005 % ophthalmic solution Commonly known as: XALATAN Place 1 drop into the left eye at bedtime.   methocarbamol 500 MG tablet Commonly known as: Robaxin Take 1 tablet (500 mg total) by mouth every 8 (eight) hours as needed for muscle spasms.   multivitamin tablet Take 1 tablet daily by mouth.   naproxen sodium 220 MG tablet Commonly known as: ALEVE Take 220 mg by mouth at bedtime as needed (pain).   nystatin-triamcinolone cream Commonly known as: MYCOLOG II Apply 1 application topically daily as needed (rash).   oxyCODONE 5 MG immediate release tablet Commonly known as: Roxicodone Take 2 tablets (10 mg total) by mouth every 4 (four) hours as needed for severe pain.   Ozempic (0.25 or 0.5 MG/DOSE) 2 MG/1.5ML Sopn Generic drug: Semaglutide(0.25 or 0.5MG /DOS) Inject 0.25-0.5 mg into the skin every Thursday.   tamsulosin 0.4 MG Caps capsule Commonly known as: FLOMAX Take 0.4 mg by mouth at bedtime.   telmisartan 80 MG tablet Commonly known as: MICARDIS Take 80 mg by mouth at bedtime.   traMADol 50 MG tablet Commonly known as: ULTRAM Take 50 mg by mouth daily as needed for moderate pain.   venlafaxine XR 75 MG 24 hr capsule Commonly known as: EFFEXOR-XR Take 150 mg by mouth daily.   vitamin E 180 MG (400 UNITS) capsule Take 400 Units daily by mouth.   zinc gluconate 50 MG tablet Take 50 mg by mouth daily.        Diagnostic Studies: DG MINI C-ARM IMAGE ONLY  Result Date: 05/02/2020 There is no interpretation for this exam.  This order is for images obtained during a surgical procedure.  Please See "Surgeries" Tab for more information regarding the procedure.    They benefited maximally from their hospital stay and there were no complications.     Disposition: Discharge disposition: 01-Home or Self Care  Discharge Instructions    Call MD / Call 911   Complete by: As directed    If you experience chest pain or shortness of breath, CALL 911 and be transported to the hospital emergency room.  If you develope a fever above 101 F, pus (white drainage) or increased drainage or redness at the wound, or calf pain, call your surgeon's office.   Constipation Prevention   Complete by: As directed    Drink plenty of fluids.  Prune juice may be helpful.  You may use a stool softener, such as Colace (over the counter) 100 mg twice a day.  Use MiraLax (over the counter) for constipation as needed.   Diet - low sodium heart healthy   Complete by: As directed    Increase activity slowly as tolerated   Complete by: As directed      Follow-up Information    Roseanne Kaufman, MD Follow up.   Specialty: Orthopedic Surgery Contact information: [address] [place] [address] [phone]          Status post surgical reconstruction left triceps with ulnar nerve release and bursectomy.  Patient is doing well postop day 1.  Drain is removed.  He is without complications or signs of DVT infection or vascular compromise.  I discussed all issues with he and his wife.  Discharge medicines Keflex x7 days oxycodone and Robaxin.  These notes been discussed all questions addressed.  I will see him in the office in 14 days  Signed: BLAIDEN LAC III 05/03/2020, 9:11 AM

## 2020-05-03 NOTE — Discharge Instructions (Signed)

## 2020-05-03 NOTE — Evaluation (Signed)
Occupational Therapy Evaluation and Discharge Patient Details Name: Tommy [name] MRN: [id number] DOB: Aug 04, 1941 Today's Date: 05/03/2020    History of Present Illness Pt is s/p fall with left triceps rupture and now s/p repair   Clinical Impression   This 79 yo male admitted and underwent above presents to acute OT with all education completed with pt and wife, we will sign off. No PT needs identified, I have made them aware.    Follow Up Recommendations  No OT follow up;Supervision - Intermittent    Equipment Recommendations  None recommended by OT       Precautions / Restrictions Precautions Precautions: Fall Restrictions Weight Bearing Restrictions: Yes LUE Weight Bearing: Non weight bearing      Mobility Bed Mobility Overal bed mobility: Modified Independent             General bed mobility comments: HOB up  Transfers Overall transfer level: Needs assistance Equipment used: None Transfers: Sit to/from Stand Sit to Stand: Supervision         General transfer comment: Once in standing takes a few seconds to get balanced before walking (due to bad hip). I recommended he use his SPC at least when he is out of the house (due to bad hip and now with extra weight of LUE casting--balance will be different)    Balance Overall balance assessment: Mild deficits observed, not formally tested                                         ADL either performed or assessed with clinical judgement   ADL                                         General ADL Comments: Wife A him with bathing and dressing prn (was doing this pta due to current injury). They were already aware of putting LUE in shirt first. We spoke about him wanting to get a shower. They are aware that the splinting/casting needs to stay dry (recommended they wrap it in Press n seal, and then double bag it with rubberbands at the top).     Vision Patient Visual Report: No  change from baseline              Pertinent Vitals/Pain Pain Assessment: No/denies pain     Hand Dominance Right   Extremity/Trunk Assessment Upper Extremity Assessment Upper Extremity Assessment: LUE deficits/detail LUE Deficits / Details: sx this admission. AROM of digits WNL within constraints of casting/splinting. Pt with limited ROM of left shoulder (past shoulder sx), but can move his shoulder some. LUE Coordination: decreased fine motor;decreased gross motor           Communication Communication Communication: No difficulties   Cognition Arousal/Alertness: Awake/alert Behavior During Therapy: WFL for tasks assessed/performed Overall Cognitive Status: Within Functional Limits for tasks assessed                                        Exercises Other Exercises Other Exercises: Educated on hand and shoulder exercises as well as keeping arm propped up when he is sitting or laying down        Home Living Family/patient expects to be  discharged to:: Private residence Living Arrangements: Spouse/significant other Available Help at Discharge: Family;Available 24 hours/day Type of Home: House Home Access: Stairs to enter [employer] of Steps: 2 Entrance Stairs-Rails: Right;Left;Can reach both Home Layout: One level               Home Equipment: Walker - 2 wheels;Cane - single point          Prior Functioning/Environment Level of Independence: Independent                 OT Problem List: Decreased strength;Decreased range of motion;Impaired UE functional use         OT Goals(Current goals can be found in the care plan section) Acute Rehab OT Goals Patient Stated Goal: to go home and get back to working on my cars  OT Frequency:                AM-PAC OT "6 Clicks" Daily Activity     Outcome Measure Help from another person eating meals?: A Little Help from another person taking care of personal grooming?: A  Little Help from another person toileting, which includes using toliet, bedpan, or urinal?: A Little Help from another person bathing (including washing, rinsing, drying)?: A Lot Help from another person to put on and taking off regular upper body clothing?: A Lot Help from another person to put on and taking off regular lower body clothing?: A Lot 6 Click Score: 15   End of Session Nurse Communication: Mobility status(pt ready to go)  Activity Tolerance: Patient tolerated treatment well Patient left: in chair;with call bell/phone within reach;with family/visitor present  OT Visit Diagnosis: Other abnormalities of gait and mobility (R26.89)                TimeSR:[id number] OT Time Calculation (min): 22 min Charges:  OT General Charges $OT Visit: 1 Visit OT Evaluation $OT Eval Moderate Complexity: 1 Mod  Golden Circle, OTR/L Acute [employer] Pager [phone] Office [phone]     Almon Register 05/03/2020, 10:22 AM

## 2020-05-05 ENCOUNTER — Encounter: Payer: Self-pay | Admitting: *Deleted

## 2020-05-06 DIAGNOSIS — Z4789 Encounter for other orthopedic aftercare: Secondary | ICD-10-CM | POA: Diagnosis not present

## 2020-05-08 DIAGNOSIS — M1611 Unilateral primary osteoarthritis, right hip: Secondary | ICD-10-CM | POA: Diagnosis not present

## 2020-05-08 DIAGNOSIS — M25629 Stiffness of unspecified elbow, not elsewhere classified: Secondary | ICD-10-CM | POA: Diagnosis not present

## 2020-05-08 DIAGNOSIS — Z6841 Body Mass Index (BMI) 40.0 and over, adult: Secondary | ICD-10-CM | POA: Diagnosis not present

## 2020-05-23 DIAGNOSIS — M25629 Stiffness of unspecified elbow, not elsewhere classified: Secondary | ICD-10-CM | POA: Diagnosis not present

## 2020-05-23 DIAGNOSIS — M25622 Stiffness of left elbow, not elsewhere classified: Secondary | ICD-10-CM | POA: Diagnosis not present

## 2020-05-30 DIAGNOSIS — M25629 Stiffness of unspecified elbow, not elsewhere classified: Secondary | ICD-10-CM | POA: Diagnosis not present

## 2020-06-05 DIAGNOSIS — M25629 Stiffness of unspecified elbow, not elsewhere classified: Secondary | ICD-10-CM | POA: Diagnosis not present

## 2020-06-18 DIAGNOSIS — I10 Essential (primary) hypertension: Secondary | ICD-10-CM | POA: Diagnosis not present

## 2020-06-18 DIAGNOSIS — R7301 Impaired fasting glucose: Secondary | ICD-10-CM | POA: Diagnosis not present

## 2020-06-20 DIAGNOSIS — M25629 Stiffness of unspecified elbow, not elsewhere classified: Secondary | ICD-10-CM | POA: Diagnosis not present

## 2020-06-27 DIAGNOSIS — G4733 Obstructive sleep apnea (adult) (pediatric): Secondary | ICD-10-CM | POA: Diagnosis not present

## 2020-07-03 DIAGNOSIS — M25629 Stiffness of unspecified elbow, not elsewhere classified: Secondary | ICD-10-CM | POA: Diagnosis not present

## 2020-07-04 DIAGNOSIS — H401111 Primary open-angle glaucoma, right eye, mild stage: Secondary | ICD-10-CM | POA: Diagnosis not present

## 2020-07-04 DIAGNOSIS — M1611 Unilateral primary osteoarthritis, right hip: Secondary | ICD-10-CM | POA: Diagnosis not present

## 2020-07-04 DIAGNOSIS — H401123 Primary open-angle glaucoma, left eye, severe stage: Secondary | ICD-10-CM | POA: Diagnosis not present

## 2020-07-17 DIAGNOSIS — M25629 Stiffness of unspecified elbow, not elsewhere classified: Secondary | ICD-10-CM | POA: Diagnosis not present

## 2020-07-28 DIAGNOSIS — M25629 Stiffness of unspecified elbow, not elsewhere classified: Secondary | ICD-10-CM | POA: Diagnosis not present

## 2020-07-30 DIAGNOSIS — G4733 Obstructive sleep apnea (adult) (pediatric): Secondary | ICD-10-CM | POA: Diagnosis not present

## 2020-08-07 ENCOUNTER — Other Ambulatory Visit: Payer: Self-pay | Admitting: Internal Medicine

## 2020-08-07 DIAGNOSIS — M25551 Pain in right hip: Secondary | ICD-10-CM | POA: Diagnosis not present

## 2020-08-07 DIAGNOSIS — E876 Hypokalemia: Secondary | ICD-10-CM | POA: Diagnosis not present

## 2020-08-07 DIAGNOSIS — D72829 Elevated white blood cell count, unspecified: Secondary | ICD-10-CM | POA: Diagnosis not present

## 2020-08-07 DIAGNOSIS — I10 Essential (primary) hypertension: Secondary | ICD-10-CM | POA: Diagnosis not present

## 2020-08-07 DIAGNOSIS — R109 Unspecified abdominal pain: Secondary | ICD-10-CM | POA: Diagnosis not present

## 2020-08-07 DIAGNOSIS — E785 Hyperlipidemia, unspecified: Secondary | ICD-10-CM | POA: Diagnosis not present

## 2020-08-07 DIAGNOSIS — K5900 Constipation, unspecified: Secondary | ICD-10-CM | POA: Diagnosis not present

## 2020-08-07 DIAGNOSIS — R7301 Impaired fasting glucose: Secondary | ICD-10-CM | POA: Diagnosis not present

## 2020-08-07 DIAGNOSIS — R112 Nausea with vomiting, unspecified: Secondary | ICD-10-CM | POA: Diagnosis not present

## 2020-08-07 DIAGNOSIS — R42 Dizziness and giddiness: Secondary | ICD-10-CM | POA: Diagnosis not present

## 2020-08-07 DIAGNOSIS — D72119 Hypereosinophilic syndrome (hes), unspecified: Secondary | ICD-10-CM

## 2020-08-08 ENCOUNTER — Ambulatory Visit
Admission: RE | Admit: 2020-08-08 | Discharge: 2020-08-08 | Disposition: A | Discharge status: Home / Self Care | Payer: Medicare HMO | Source: Ambulatory Visit | Attending: Internal Medicine | Admitting: Internal Medicine

## 2020-08-08 ENCOUNTER — Other Ambulatory Visit: Payer: Self-pay | Admitting: Internal Medicine

## 2020-08-08 DIAGNOSIS — K4090 Unilateral inguinal hernia, without obstruction or gangrene, not specified as recurrent: Secondary | ICD-10-CM | POA: Diagnosis not present

## 2020-08-08 DIAGNOSIS — I700 Atherosclerosis of aorta: Secondary | ICD-10-CM | POA: Diagnosis not present

## 2020-08-08 DIAGNOSIS — D72119 Hypereosinophilic syndrome (hes), unspecified: Secondary | ICD-10-CM

## 2020-08-08 DIAGNOSIS — K8020 Calculus of gallbladder without cholecystitis without obstruction: Secondary | ICD-10-CM | POA: Diagnosis not present

## 2020-08-08 DIAGNOSIS — N132 Hydronephrosis with renal and ureteral calculous obstruction: Secondary | ICD-10-CM | POA: Diagnosis not present

## 2020-08-08 DIAGNOSIS — N201 Calculus of ureter: Secondary | ICD-10-CM | POA: Diagnosis not present

## 2020-08-08 MED ORDER — IOPAMIDOL (ISOVUE-300) INJECTION 61%: 100.0000 mL | Freq: Once | INTRAVENOUS | Status: DC | PRN

## 2020-08-11 DIAGNOSIS — Z6841 Body Mass Index (BMI) 40.0 and over, adult: Secondary | ICD-10-CM | POA: Diagnosis not present

## 2020-08-11 DIAGNOSIS — M1611 Unilateral primary osteoarthritis, right hip: Secondary | ICD-10-CM | POA: Diagnosis not present

## 2020-08-14 DIAGNOSIS — M66822 Spontaneous rupture of other tendons, left upper arm: Secondary | ICD-10-CM | POA: Diagnosis not present

## 2020-08-14 DIAGNOSIS — M25522 Pain in left elbow: Secondary | ICD-10-CM | POA: Diagnosis not present

## 2020-08-14 DIAGNOSIS — Z4789 Encounter for other orthopedic aftercare: Secondary | ICD-10-CM | POA: Diagnosis not present

## 2020-08-14 DIAGNOSIS — M1611 Unilateral primary osteoarthritis, right hip: Secondary | ICD-10-CM | POA: Diagnosis not present

## 2020-08-21 DIAGNOSIS — N182 Chronic kidney disease, stage 2 (mild): Secondary | ICD-10-CM | POA: Diagnosis not present

## 2020-09-04 ENCOUNTER — Ambulatory Visit: Payer: Medicare HMO | Admitting: Adult Health

## 2020-09-04 ENCOUNTER — Encounter: Payer: Self-pay | Admitting: Adult Health

## 2020-09-04 ENCOUNTER — Other Ambulatory Visit: Payer: Self-pay

## 2020-09-04 VITALS — BP 120/71 | HR 101 | Ht 65.0 in | Wt 245.0 lb

## 2020-09-04 DIAGNOSIS — Z9989 Dependence on other enabling machines and devices: Secondary | ICD-10-CM

## 2020-09-04 DIAGNOSIS — G4733 Obstructive sleep apnea (adult) (pediatric): Secondary | ICD-10-CM | POA: Diagnosis not present

## 2020-09-04 NOTE — Patient Instructions (Signed)
Continue using CPAP nightly and greater than 4 hours each night °If your symptoms worsen or you develop new symptoms please let us know.  ° °

## 2020-09-04 NOTE — Progress Notes (Signed)
PATIENT: Tommy [name] DOB: 05-Nov-1941  REASON FOR VISIT: follow up HISTORY FROM: patient  HISTORY OF PRESENT ILLNESS: Today 09/04/20:  Mr. Tommy [name] is a 79 year old male with a history of obstructive sleep apnea on CPAP.  His download indicates that he uses machine 28 out of 30 days for compliance of 93%.  He uses machine greater than 4 hours 18 days for compliance of 60%.  On average he uses his machine 5 hours and 28 minutes.  His residual AHI is 4.3 on 8 to 20 cm of water with EPR 3.  Leak in the 95th percentile is 25.5 L/min.  He reports that he has been having hip and knee pain. He is getting ready to have hip replacement and reports that keeps him for sleeping all night. He returns today for follow-up.  HISTORY  09/04/19: Mr. Tommy [name] is a 79 year old male with a history of obstructive sleep apnea on CPAP.  His download indicates that he uses machine nightly for compliance of 100%.  He uses machine greater than 4 hours 27 days for compliance of 90%.  He uses machine on average 6 hours and 32 minutes.  His residual AHI is 6.3 on 8 to 18 cm of water with EPR of 3.  His leak in the 95th percentile is 24.1 L/min.  He states that night he does feel the mask leaking.  He has tried adjusting and that sometimes will fix the leak.  He denies any new issues.  He returns today for an evaluation.  REVIEW OF SYSTEMS: Out of a complete 14 system review of symptoms, the patient complains only of the following symptoms, and all other reviewed systems are negative.   ESS 2  ALLERGIES: No Known Allergies  HOME MEDICATIONS: Outpatient Medications Prior to Visit  Medication Sig Dispense Refill  . acetaminophen (TYLENOL) 500 MG tablet Take 1,000 mg by mouth every 6 (six) hours as needed for moderate pain or headache.    Marland Kitchen amLODipine (NORVASC) 10 MG tablet Take 10 mg by mouth every morning.    Marland Kitchen aspirin EC 81 MG tablet Take 81 mg by mouth daily.    Marland Kitchen atorvastatin (LIPITOR) 20 MG tablet Take 20 mg by  mouth every evening.    . brimonidine (ALPHAGAN) 0.2 % ophthalmic solution Place 1 drop into the left eye 3 (three) times daily.   11  . buPROPion (WELLBUTRIN) 75 MG tablet Take 75 mg by mouth daily.    . Cholecalciferol (DIALYVITE VITAMIN D 5000) 125 MCG (5000 UT) capsule Take 5,000 Units by mouth daily.    . dorzolamide-timolol (COSOPT) 22.3-6.8 MG/ML ophthalmic solution Place 1 drop into the left eye 2 (two) times daily.     . fluticasone (FLONASE) 50 MCG/ACT nasal spray Place 2 sprays into the nose daily as needed for allergies.     Marland Kitchen latanoprost (XALATAN) 0.005 % ophthalmic solution Place 1 drop into the left eye at bedtime.    . methocarbamol (ROBAXIN) 500 MG tablet Take 1 tablet (500 mg total) by mouth every 8 (eight) hours as needed for muscle spasms. 40 tablet 0  . Multiple Vitamin (MULTIVITAMIN) tablet Take 1 tablet daily by mouth.    . naproxen sodium (ALEVE) 220 MG tablet Take 220 mg by mouth at bedtime as needed (pain).    . nystatin-triamcinolone (MYCOLOG II) cream Apply 1 application topically daily as needed (rash).     Marland Kitchen oxyCODONE (ROXICODONE) 5 MG immediate release tablet Take 2 tablets (10 mg total) by mouth  every 4 (four) hours as needed for severe pain. 40 tablet 0  . Semaglutide,0.25 or 0.5MG /DOS, (OZEMPIC, 0.25 OR 0.5 MG/DOSE,) 2 MG/1.5ML SOPN Inject 0.25-0.5 mg into the skin every Thursday.    . tamsulosin (FLOMAX) 0.4 MG CAPS capsule Take 0.4 mg by mouth at bedtime.     Marland Kitchen telmisartan (MICARDIS) 80 MG tablet Take 80 mg by mouth at bedtime.     . traMADol (ULTRAM) 50 MG tablet Take 50 mg by mouth daily as needed for moderate pain.     Marland Kitchen venlafaxine XR (EFFEXOR-XR) 75 MG 24 hr capsule Take 150 mg by mouth daily.     . vitamin E 400 UNIT capsule Take 400 Units daily by mouth.    . zinc gluconate 50 MG tablet Take 50 mg by mouth daily.     No facility-administered medications prior to visit.    PAST MEDICAL HISTORY: Past Medical History:  Diagnosis Date  . Arthritis   .  History of kidney stones   . Hyperlipemia   . Hypertension   . Hypoxemia 09/02/2014  . OSA on CPAP   . PLMD (periodic limb movement disorder) 09/02/2014  . Seasonal allergies   . Sleep apnea    uses a cpap    PAST SURGICAL HISTORY: Past Surgical History:  Procedure Laterality Date  . BUNIONECTOMY Right 01/03/2014   Procedure: RIGHT LAPIDUS BUNION CORRECTION;  Surgeon: Wylene Simmer, MD;  Location: [facility];  Service: Orthopedics;  Laterality: Right;  . JOINT REPLACEMENT     bilateral knee replacemts  . JOINT REPLACEMENT     bilateral shoulder replacemts  . KNEE ARTHROSCOPY     right and left  . SHOULDER ARTHROSCOPY  2011   right  . TOTAL KNEE ARTHROPLASTY  2005   right  . TOTAL KNEE ARTHROPLASTY  2004   left  . TOTAL SHOULDER ARTHROPLASTY  2012   left  . TRICEPS TENDON REPAIR Left 05/02/2020   Procedure: Left triceps tendon repair reconstruction as necessary with bursectomy and ulnar nerve release at elbow;  Surgeon: Roseanne Kaufman, MD;  Location: [facility];  Service: Orthopedics;  Laterality: Left;  2 hrs    FAMILY HISTORY: Family History  Problem Relation Age of Onset  . Glaucoma Father   . Alzheimer's disease Mother   . Dementia Mother   . Heart Problems Maternal Grandfather   . Heart Problems Maternal Grandmother     SOCIAL HISTORY: Social History   Socioeconomic History  . Marital status: Married    Spouse name: Vaughan Basta  . Number of children: 4  . Years of education: 11  . Highest education level: Not on file  Occupational History  . Not on file  Tobacco Use  . Smoking status: Former Smoker    Quit date: 01/20/1983    Years since quitting: 37.6  . Smokeless tobacco: Never Used  Substance and Sexual Activity  . Alcohol use: No    Alcohol/week: 0.0 standard drinks  . Drug use: No  . Sexual activity: Not on file  Other Topics Concern  . Not on file  Social History Narrative   Patient lives at home alone and he is widowed.   Patient has four  adult children.   Patient is retired.   Patient has a college education.   Patient is right-handed.   Patient drinks three-four sodas per week.   Social Determinants of Health   Financial Resource Strain:   . Difficulty of Paying Living Expenses: Not on file  Food Insecurity:   . Worried About Charity fundraiser in the Last Year: Not on file  . Ran Out of Food in the Last Year: Not on file  Transportation Needs:   . Lack of Transportation (Medical): Not on file  . Lack of Transportation (Non-Medical): Not on file  Physical Activity:   . Days of Exercise per Week: Not on file  . Minutes of Exercise per Session: Not on file  Stress:   . Feeling of Stress : Not on file  Social Connections:   . Frequency of Communication with Friends and Family: Not on file  . Frequency of Social Gatherings with Friends and Family: Not on file  . Attends Religious Services: Not on file  . Active Member of Clubs or Organizations: Not on file  . Attends Archivist Meetings: Not on file  . Marital Status: Not on file  Intimate Partner Violence:   . Fear of Current or Ex-Partner: Not on file  . Emotionally Abused: Not on file  . Physically Abused: Not on file  . Sexually Abused: Not on file      PHYSICAL EXAM  Vitals:   09/04/20 0901  BP: 120/71  Pulse: (!) 101  Weight: 245 lb (111.1 kg)  Height: 5\' 5"  (1.651 m)   Body mass index is 40.77 kg/m.  Generalized: Well developed, in no acute distress  Chest: Lungs clear to auscultation bilaterally  Neurological examination  Mentation: Alert oriented to time, place, history taking. Follows all commands speech and language fluent Cranial nerve II-XII: Extraocular movements were full, visual field were full on confrontational test Head turning and shoulder shrug  were normal and symmetric. Motor: The motor testing reveals 5 over 5 strength of all 4 extremities. Good symmetric motor tone is noted throughout.  Sensory: Sensory testing  is intact to soft touch on all 4 extremities. No evidence of extinction is noted.  Gait and station: Gait is normal.    DIAGNOSTIC DATA (LABS, IMAGING, TESTING) - I reviewed patient records, labs, notes, testing and imaging myself where available.  Lab Results  Component Value Date   WBC 6.8 10/04/2011   HGB 15.8 01/03/2014   HCT 47.0 10/04/2011   MCV 87.2 10/04/2011   PLT 211 10/04/2011      Component Value Date/Time   NA 138 05/02/2020 1225   K 4.0 05/02/2020 1225   CL 102 05/02/2020 1225   CO2 27 05/02/2020 1225   GLUCOSE 102 (H) 05/02/2020 1225   BUN 18 05/02/2020 1225   CREATININE 1.23 05/02/2020 1225   CALCIUM 9.0 05/02/2020 1225   PROT 7.5 10/04/2011 0957   ALBUMIN 4.0 10/04/2011 0957   AST 22 10/04/2011 0957   ALT 21 10/04/2011 0957   ALKPHOS 67 10/04/2011 0957   BILITOT 0.4 10/04/2011 0957   GFRNONAA 56 (L) 05/02/2020 1225   GFRAA >60 05/02/2020 1225     ASSESSMENT AND PLAN 79 y.o. year old male  has a past medical history of Arthritis, History of kidney stones, Hyperlipemia, Hypertension, Hypoxemia (09/02/2014), OSA on CPAP, PLMD (periodic limb movement disorder) (09/02/2014), Seasonal allergies, and Sleep apnea. here with:  1. OSA on CPAP  - CPAP compliance excellent - Good treatment of AHI  - Encourage patient to use CPAP nightly and > 4 hours each night - F/U in 1 year or sooner if needed   I spent 20 minutes of face-to-face and non-face-to-face time with patient.  This included previsit chart review, lab review, study review, order  entry, electronic health record documentation, patient education.  Ward Givens, MSN, NP-C 09/04/2020, 9:23 AM [facility] [address] [place], [place] [address] [phone]

## 2020-09-08 DIAGNOSIS — D72829 Elevated white blood cell count, unspecified: Secondary | ICD-10-CM | POA: Diagnosis not present

## 2020-09-08 DIAGNOSIS — I10 Essential (primary) hypertension: Secondary | ICD-10-CM | POA: Diagnosis not present

## 2020-09-08 DIAGNOSIS — M25551 Pain in right hip: Secondary | ICD-10-CM | POA: Diagnosis not present

## 2020-09-08 DIAGNOSIS — R82998 Other abnormal findings in urine: Secondary | ICD-10-CM | POA: Diagnosis not present

## 2020-09-08 DIAGNOSIS — E785 Hyperlipidemia, unspecified: Secondary | ICD-10-CM | POA: Diagnosis not present

## 2020-09-08 DIAGNOSIS — R7301 Impaired fasting glucose: Secondary | ICD-10-CM | POA: Diagnosis not present

## 2020-09-08 DIAGNOSIS — Z23 Encounter for immunization: Secondary | ICD-10-CM | POA: Diagnosis not present

## 2020-09-11 DIAGNOSIS — D72829 Elevated white blood cell count, unspecified: Secondary | ICD-10-CM | POA: Diagnosis not present

## 2020-09-11 DIAGNOSIS — I129 Hypertensive chronic kidney disease with stage 1 through stage 4 chronic kidney disease, or unspecified chronic kidney disease: Secondary | ICD-10-CM | POA: Diagnosis not present

## 2020-09-11 DIAGNOSIS — R7301 Impaired fasting glucose: Secondary | ICD-10-CM | POA: Diagnosis not present

## 2020-09-11 DIAGNOSIS — M1611 Unilateral primary osteoarthritis, right hip: Secondary | ICD-10-CM | POA: Diagnosis not present

## 2020-09-11 DIAGNOSIS — E876 Hypokalemia: Secondary | ICD-10-CM | POA: Diagnosis not present

## 2020-09-11 DIAGNOSIS — N182 Chronic kidney disease, stage 2 (mild): Secondary | ICD-10-CM | POA: Diagnosis not present

## 2020-09-11 DIAGNOSIS — E785 Hyperlipidemia, unspecified: Secondary | ICD-10-CM | POA: Diagnosis not present

## 2020-09-29 DIAGNOSIS — Z6841 Body Mass Index (BMI) 40.0 and over, adult: Secondary | ICD-10-CM | POA: Diagnosis not present

## 2020-09-29 DIAGNOSIS — M1611 Unilateral primary osteoarthritis, right hip: Secondary | ICD-10-CM | POA: Diagnosis not present

## 2020-09-29 DIAGNOSIS — H401111 Primary open-angle glaucoma, right eye, mild stage: Secondary | ICD-10-CM | POA: Diagnosis not present

## 2020-09-29 DIAGNOSIS — H401123 Primary open-angle glaucoma, left eye, severe stage: Secondary | ICD-10-CM | POA: Diagnosis not present

## 2020-10-02 NOTE — Patient Instructions (Addendum)
DUE TO COVID-19 ONLY ONE VISITOR IS ALLOWED TO COME WITH YOU AND STAY IN THE WAITING ROOM ONLY DURING PRE OP AND PROCEDURE DAY OF SURGERY. THE 1 VISITOR  MAY VISIT WITH YOU AFTER SURGERY IN YOUR PRIVATE ROOM DURING VISITING HOURS ONLY!  YOU NEED TO HAVE A COVID 19 TEST ON: 10/10/20 @ 10:00 AM , THIS TEST MUST BE DONE BEFORE SURGERY,  COVID TESTING SITE [place] [address], IT IS ON THE RIGHT GOING OUT [address] APPROXIMATELY  2 MINUTES PAST [employer] ON THE RIGHT. ONCE YOUR COVID TEST IS COMPLETED,  PLEASE BEGIN THE QUARANTINE INSTRUCTIONS AS OUTLINED IN YOUR HANDOUT.                Tommy [name]     Your procedure is scheduled on:10/14/20    Report to [facility] Main  Entrance   Report to admitting at: 10:00 AM     Call this number if you have problems the morning of surgery [phone]    Remember:   NO SOLID FOOD AFTER MIDNIGHT THE NIGHT PRIOR TO SURGERY. NOTHING BY MOUTH EXCEPT CLEAR LIQUIDS UNTIL: 9:30 AM . PLEASE FINISH ENSURE DRINK PER SURGEON ORDER  WHICH NEEDS TO BE COMPLETED AT: 9:30 AM .  CLEAR LIQUID DIET   Foods Allowed                                                                     Foods Excluded  Coffee and tea, regular and decaf                             liquids that you cannot  Plain Jell-O any favor except red or purple                                           see through such as: Fruit ices (not with fruit pulp)                                     milk, soups, orange juice  Iced Popsicles                                    All solid food Carbonated beverages, regular and diet                                    Cranberry, grape and apple juices Sports drinks like Gatorade Lightly seasoned clear broth or consume(fat free) Sugar, honey syrup  Sample Menu Breakfast                                Lunch  Supper Cranberry juice                    Beef broth                             Chicken broth Jell-O                                     Grape juice                           Apple juice Coffee or tea                        Jell-O                                      Popsicle                                                Coffee or tea                        Coffee or tea  _____________________________________________________________________   BRUSH YOUR TEETH MORNING OF SURGERY AND RINSE YOUR MOUTH OUT, NO CHEWING GUM CANDY OR MINTS.     Take these medicines the morning of surgery with A SIP OF WATER: amlodipine,bupropion.Use eye drops and flonase as usual.                               You may not have any metal on your body including hair pins and              piercings  Do not wear jewelry, lotions, powders or perfumes, deodorant             Men may shave face and neck.   Do not bring valuables to the hospital. Tommy [name].  Contacts, dentures or bridgework may not be worn into surgery.  Leave suitcase in the car. After surgery it may be brought to your room.     Patients discharged the day of surgery will not be allowed to drive home. IF YOU ARE HAVING SURGERY AND GOING HOME THE SAME DAY, YOU MUST HAVE AN ADULT TO DRIVE YOU HOME AND BE WITH YOU FOR 24 HOURS. YOU MAY GO HOME BY TAXI OR UBER OR ORTHERWISE, BUT AN ADULT MUST ACCOMPANY YOU HOME AND STAY WITH YOU FOR 24 HOURS.  Name and phone number of your driver:  Special Instructions: N/A              Please read over the following fact sheets you were given: _____________________________________________________________________  PLEASE BRING CPAP MASK Tommy [name]. DEVICE WILL BE PROVIDED!          - Preparing for Surgery Before surgery, you can play an important role.  Because skin is not sterile, your skin needs to be as free of  germs as possible.  You can reduce the number of germs on your skin by washing with CHG (chlorahexidine  gluconate) soap before surgery.  CHG is an antiseptic cleaner which kills germs and bonds with the skin to continue killing germs even after washing. Please DO NOT use if you have an allergy to CHG or antibacterial soaps.  If your skin becomes reddened/irritated stop using the CHG and inform your nurse when you arrive at Short Stay. Do not shave (including legs and underarms) for at least 48 hours prior to the first CHG shower.  You may shave your face/neck. Please follow these instructions carefully:  1.  Shower with CHG Soap the night before surgery and the  morning of Surgery.  2.  If you choose to wash your hair, wash your hair first as usual with your  normal  shampoo.  3.  After you shampoo, rinse your hair and body thoroughly to remove the  shampoo.                           4.  Use CHG as you would any other liquid soap.  You can apply chg directly  to the skin and wash                       Gently with a scrungie or clean washcloth.  5.  Apply the CHG Soap to your body ONLY FROM THE NECK DOWN.   Do not use on face/ open                           Wound or open sores. Avoid contact with eyes, ears mouth and genitals (private parts).                       Wash face,  Genitals (private parts) with your normal soap.             6.  Wash thoroughly, paying special attention to the area where your surgery  will be performed.  7.  Thoroughly rinse your body with warm water from the neck down.  8.  DO NOT shower/wash with your normal soap after using and rinsing off  the CHG Soap.                9.  Pat yourself dry with a clean towel.            10.  Wear clean pajamas.            11.  Place clean sheets on your bed the night of your first shower and do not  sleep with pets. Day of Surgery : Do not apply any lotions/deodorants the morning of surgery.  Please wear clean clothes to the hospital/surgery center.  FAILURE TO FOLLOW THESE INSTRUCTIONS MAY RESULT IN THE CANCELLATION OF YOUR  SURGERY PATIENT SIGNATURE_________________________________  NURSE SIGNATURE__________________________________  ________________________________________________________________________   Tommy [name]  An incentive spirometer is a tool that can help keep your lungs clear and active. This tool measures how well you are filling your lungs with each breath. Taking long deep breaths may help reverse or decrease the chance of developing breathing (pulmonary) problems (especially infection) following:  A long period of time when you are unable to move or be active. BEFORE THE PROCEDURE   If the spirometer includes an indicator to show your best effort, your nurse  or respiratory therapist will set it to a desired goal.  If possible, sit up straight or lean slightly forward. Try not to slouch.  Hold the incentive spirometer in an upright position. INSTRUCTIONS FOR USE  1. Sit on the edge of your bed if possible, or sit up as far as you can in bed or on a chair. 2. Hold the incentive spirometer in an upright position. 3. Breathe out normally. 4. Place the mouthpiece in your mouth and seal your lips tightly around it. 5. Breathe in slowly and as deeply as possible, raising the piston or the ball toward the top of the column. 6. Hold your breath for 3-5 seconds or for as long as possible. Allow the piston or ball to fall to the bottom of the column. 7. Remove the mouthpiece from your mouth and breathe out normally. 8. Rest for a few seconds and repeat Steps 1 through 7 at least 10 times every 1-2 hours when you are awake. Take your time and take a few normal breaths between deep breaths. 9. The spirometer may include an indicator to show your best effort. Use the indicator as a goal to work toward during each repetition. 10. After each set of 10 deep breaths, practice coughing to be sure your lungs are clear. If you have an incision (the cut made at the time of surgery), support your  incision when coughing by placing a pillow or rolled up towels firmly against it. Once you are able to get out of bed, walk around indoors and cough well. You may stop using the incentive spirometer when instructed by your caregiver.  RISKS AND COMPLICATIONS  Take your time so you do not get dizzy or light-headed.  If you are in pain, you may need to take or ask for pain medication before doing incentive spirometry. It is harder to take a deep breath if you are having pain. AFTER USE  Rest and breathe slowly and easily.  It can be helpful to keep track of a log of your progress. Your caregiver can provide you with a simple table to help with this. If you are using the spirometer at home, follow these instructions: Tommy [name] IF:   You are having difficultly using the spirometer.  You have trouble using the spirometer as often as instructed.  Your pain medication is not giving enough relief while using the spirometer.  You develop fever of 100.5 F (38.1 C) or higher. SEEK IMMEDIATE MEDICAL CARE IF:   You cough up bloody sputum that had not been present before.  You develop fever of 102 F (38.9 C) or greater.  You develop worsening pain at or near the incision site. MAKE SURE YOU:   Understand these instructions.  Will watch your condition.  Will get help right away if you are not doing well or get worse. Document Released: 04/11/2007 Document Revised: 02/21/2012 Document Reviewed: 06/12/2007 [facility] Patient Information 2014 [place], [place].   ________________________________________________________________________

## 2020-10-02 NOTE — Progress Notes (Signed)
Pt. Needs orders for upcomming surgery.PST appointment and labs on: 10/03/20.Thanks.

## 2020-10-03 ENCOUNTER — Encounter (HOSPITAL_COMMUNITY): Payer: Self-pay

## 2020-10-03 ENCOUNTER — Encounter (HOSPITAL_COMMUNITY)
Admission: RE | Admit: 2020-10-03 | Discharge: 2020-10-03 | Disposition: A | Discharge status: Home / Self Care | Payer: Medicare HMO | Source: Ambulatory Visit | Attending: Orthopedic Surgery | Admitting: Orthopedic Surgery

## 2020-10-03 ENCOUNTER — Other Ambulatory Visit: Payer: Self-pay

## 2020-10-03 DIAGNOSIS — Z01818 Encounter for other preprocedural examination: Secondary | ICD-10-CM | POA: Diagnosis not present

## 2020-10-03 LAB — BASIC METABOLIC PANEL
Anion gap: 10 (ref 5–15)
BUN: 18 mg/dL (ref 8–23)
CO2: 28 mmol/L (ref 22–32)
Calcium: 9 mg/dL (ref 8.9–10.3)
Chloride: 100 mmol/L (ref 98–111)
Creatinine, Ser: 1.19 mg/dL (ref 0.61–1.24)
GFR, Estimated: >60 mL/min (ref >60)
Glucose, Bld: 103 mg/dL — ABNORMAL HIGH (ref 70–99)
Potassium: 4.1 mmol/L (ref 3.5–5.1)
Sodium: 138 mmol/L (ref 135–145)

## 2020-10-03 LAB — CBC
HCT: 46 % (ref 39.0–52.0)
Hemoglobin: 15.4 g/dL (ref 13.0–17.0)
MCH: 30.3 pg (ref 26.0–34.0)
MCHC: 33.5 g/dL (ref 30.0–36.0)
MCV: 90.4 fL (ref 80.0–100.0)
Platelets: 263 10*3/uL (ref 150–400)
RBC: 5.09 MIL/uL (ref 4.22–5.81)
RDW: 13.6 % (ref 11.5–15.5)
WBC: 11.3 10*3/uL — ABNORMAL HIGH (ref 4.0–10.5)
nRBC: 0 % (ref 0.0–0.2)

## 2020-10-03 LAB — SURGICAL PCR SCREEN
MRSA, PCR: NEGATIVE
Staphylococcus aureus: NEGATIVE

## 2020-10-03 NOTE — Progress Notes (Signed)
COVID Vaccine Completed:Yes Date COVID Vaccine completed: 03/04/20 COVID vaccine manufacturer: Pfizer      PCP - Dr. Betsey Amen. Cardiologist -   Chest x-ray -  EKG -  Stress Test -  ECHO -  Cardiac Cath -  Pacemaker/ICD device last checked:  Sleep Study - Yes CPAP - Yes  Fasting Blood Sugar -  Checks Blood Sugar _____ times a day  Blood Thinner Instructions: By: Dr. Mardelle Matte. Aspirin Instructions: To hold a week before surgery. Last Dose:  Anesthesia review: Hx: HTN,OSA(CPAP).  Patient denies shortness of breath, fever, cough and chest pain at PAT appointment   Patient verbalized understanding of instructions that were given to them at the PAT appointment. Patient was also instructed that they will need to review over the PAT instructions again at home before surgery.

## 2020-10-10 ENCOUNTER — Other Ambulatory Visit (HOSPITAL_COMMUNITY): Payer: Medicare HMO

## 2020-10-14 ENCOUNTER — Encounter (HOSPITAL_COMMUNITY): Admission: RE | Payer: Self-pay | Source: Home / Self Care

## 2020-10-14 ENCOUNTER — Ambulatory Visit (HOSPITAL_COMMUNITY): Admission: RE | Admit: 2020-10-14 | Payer: Medicare HMO | Source: Home / Self Care | Admitting: Orthopedic Surgery

## 2020-10-14 SURGERY — ARTHROPLASTY, HIP, TOTAL,POSTERIOR APPROACH
Anesthesia: Choice | Site: Hip | Laterality: Right

## 2020-11-04 DIAGNOSIS — G4733 Obstructive sleep apnea (adult) (pediatric): Secondary | ICD-10-CM | POA: Diagnosis not present

## 2020-11-04 IMAGING — CT CT ABD-PELV W/O CM
1 of 2 series · 14 of 32 positions shown, 19 images · non-contrast
Comparison: None.

CLINICAL DATA: Leukocytosis.

EXAM:
CT ABDOMEN AND PELVIS WITHOUT CONTRAST
TECHNIQUE: Multidetector CT imaging of the abdomen and pelvis was performed
following the standard protocol without IV contrast.

[Series 2: abd/pelvis w/(date) · axial · 0.88mm/px · z∈[-408,+17]mm · 14 of 97 slices shown, 19 images]
[im 6/97  soft-tissue]
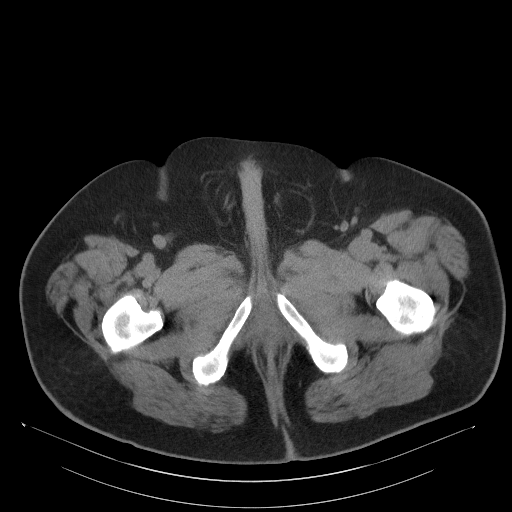
[im 6/97  bone]
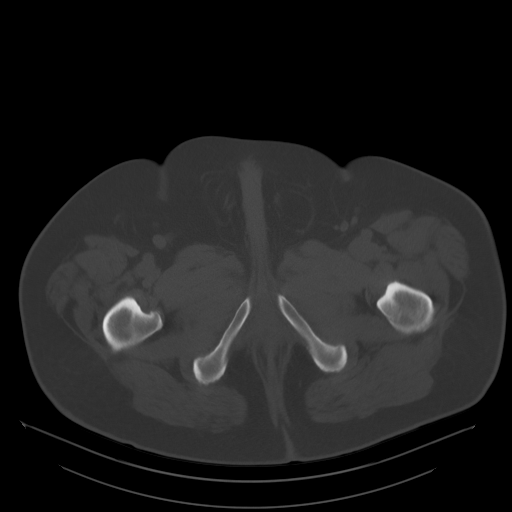
[im 12/97  soft-tissue]
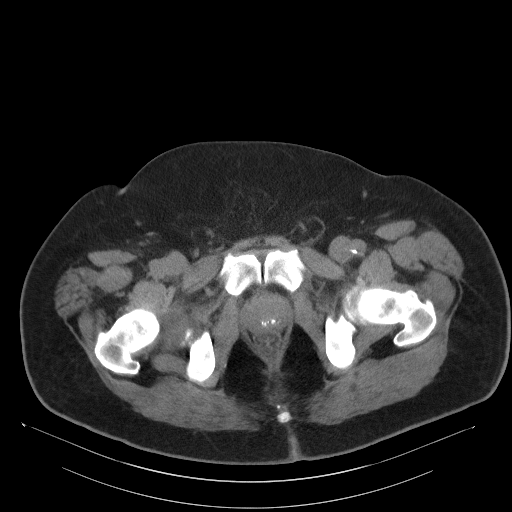
[im 23/97  soft-tissue]
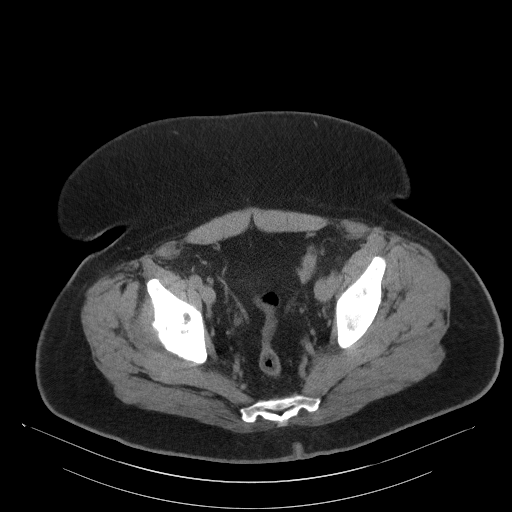
[im 29/97  soft-tissue]
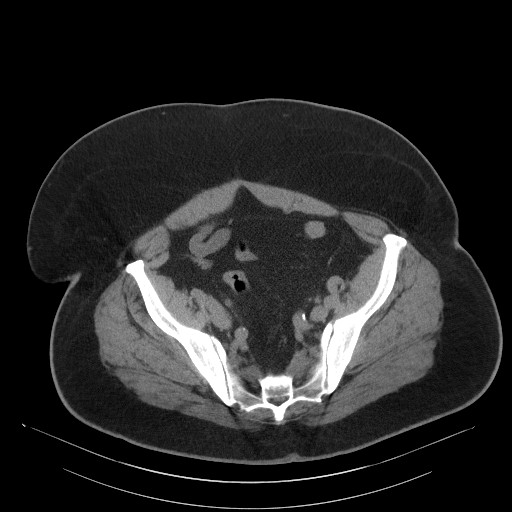
[im 34/97  soft-tissue]
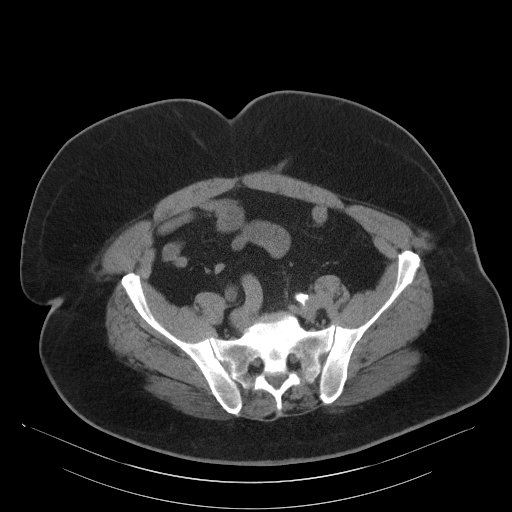
[im 40/97  soft-tissue]
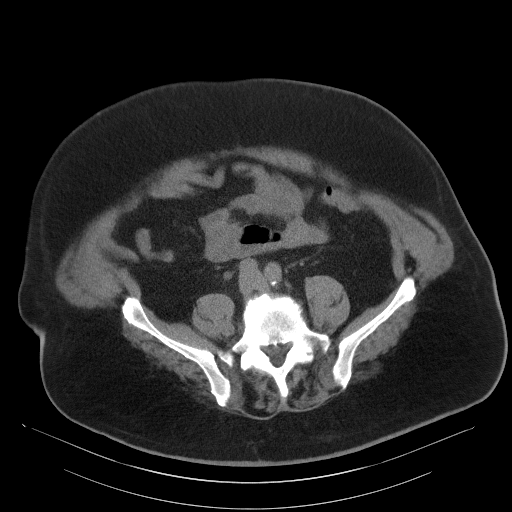
[im 51/97  soft-tissue]
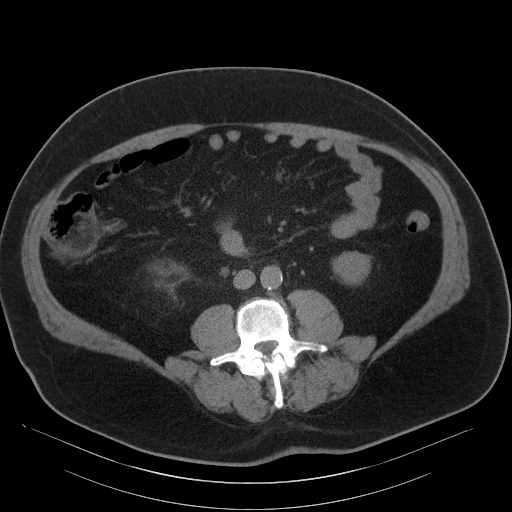
[im 57/97  soft-tissue]
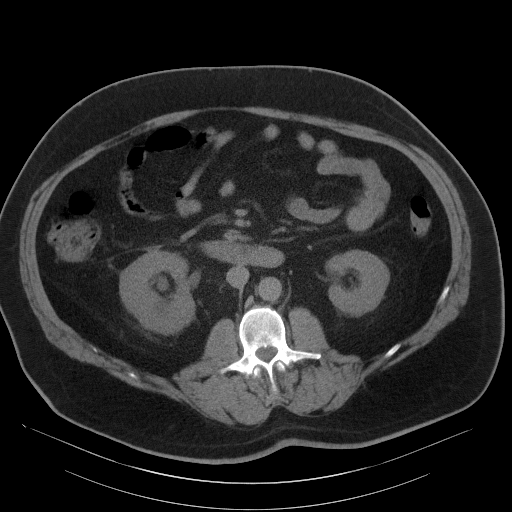
[im 63/97  soft-tissue]
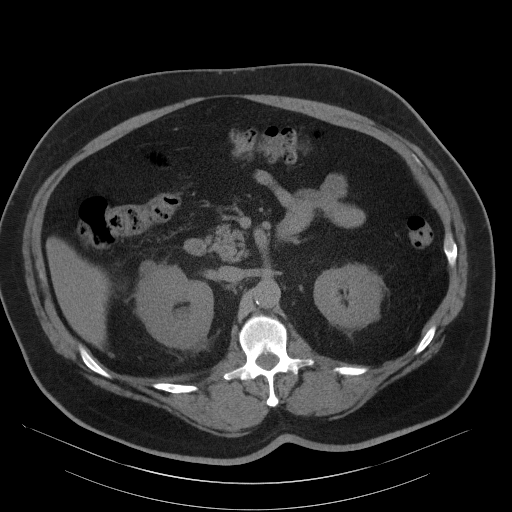
[im 63/97  bone]
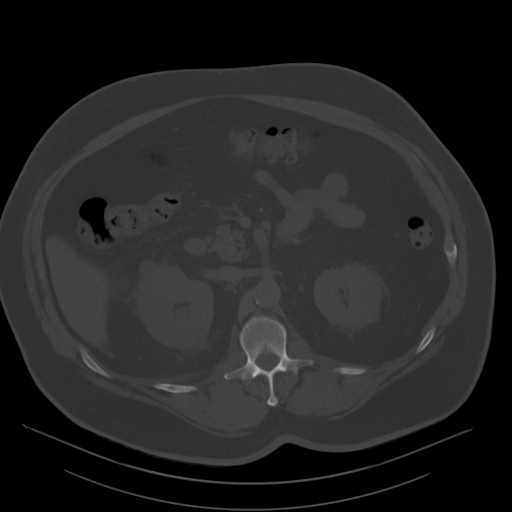
[im 68/97  soft-tissue]
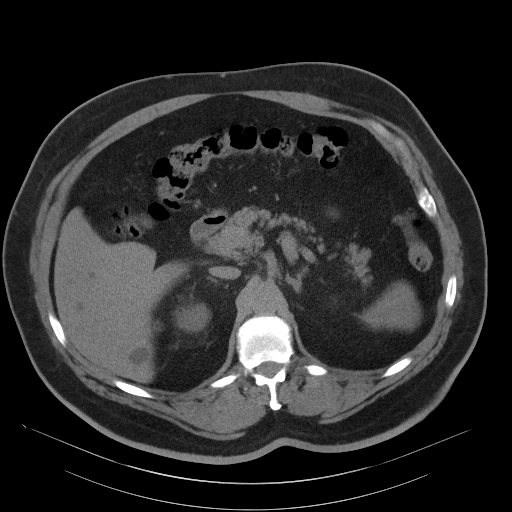
[im 74/97  soft-tissue]
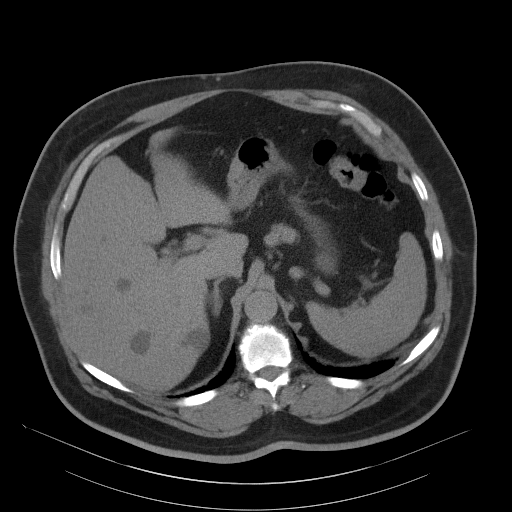
[im 74/97  lung]
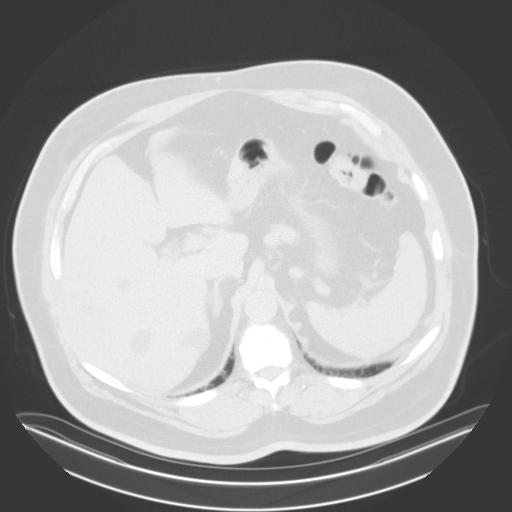
[im 80/97  lung]
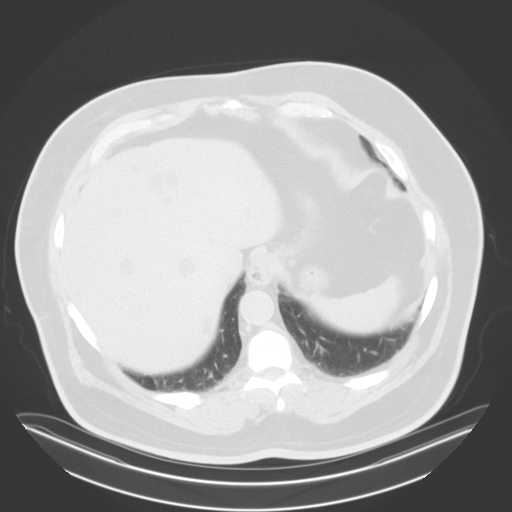
[im 85/97  soft-tissue]
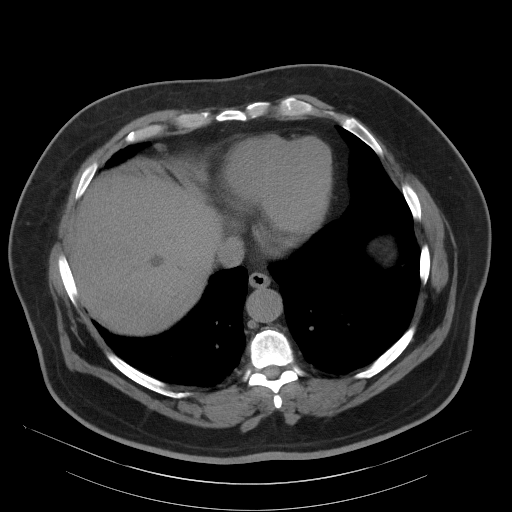
[im 85/97  lung]
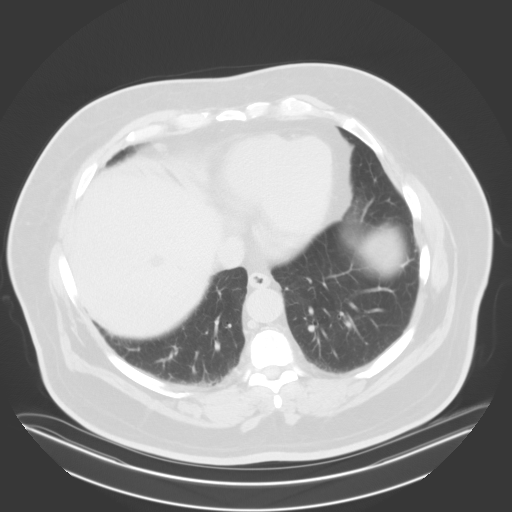
[im 91/97  soft-tissue]
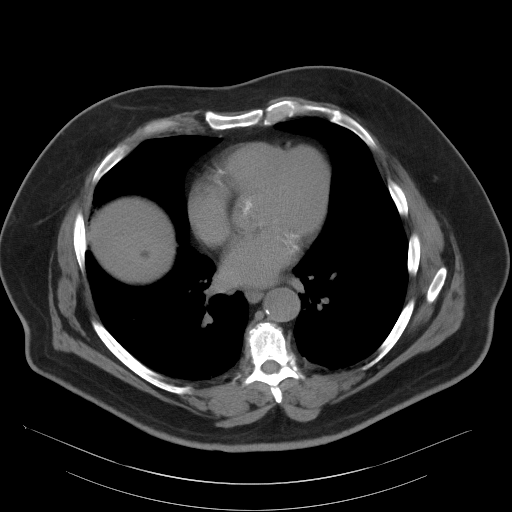
[im 91/97  lung]
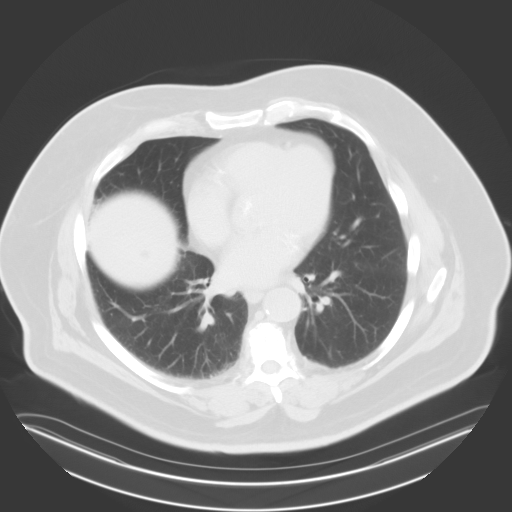

[14 of 32 positions shown; findings below may reference images not displayed]

FINDINGS: Lower chest: No acute abnormality.

Hepatobiliary: Cholelithiasis is noted. No biliary dilatation is
noted. Multiple rounded hypodense abnormalities are noted throughout
hepatic parenchyma most consistent with cysts.

Pancreas: Unremarkable. No pancreatic ductal dilatation or
surrounding inflammatory changes.

Spleen: Normal in size without focal abnormality.

Adrenals/Urinary Tract: Adrenal glands appear normal. Mild right
hydroureteronephrosis is noted secondary to 4 mm calculus at right
ureterovesical junction. Left kidney and ureter are unremarkable.
Right renal cyst is noted.

Stomach/Bowel: Stomach is within normal limits. Appendix appears
normal. No evidence of bowel wall thickening, distention, or
inflammatory changes.

Vascular/Lymphatic: Aortic atherosclerosis. No enlarged abdominal or
pelvic lymph nodes.

Reproductive: Mild prostatic calcifications are noted. The prostate
is normal in size.

Other: Mild fat containing left inguinal hernia is noted. No ascites
is noted.

Musculoskeletal: Severe degenerative changes seen involving the
right hip joint. No acute osseous abnormality is noted.
IMPRESSION: 1. Mild right hydroureteronephrosis is noted secondary to 4 mm
calculus at right ureterovesical junction.
2. Cholelithiasis.
3. Mild fat containing left inguinal hernia.
4. Aortic atherosclerosis.

Aortic Atherosclerosis ([id number]).

## 2020-11-12 DIAGNOSIS — E785 Hyperlipidemia, unspecified: Secondary | ICD-10-CM | POA: Diagnosis not present

## 2020-11-12 DIAGNOSIS — Z125 Encounter for screening for malignant neoplasm of prostate: Secondary | ICD-10-CM | POA: Diagnosis not present

## 2020-11-12 DIAGNOSIS — R7301 Impaired fasting glucose: Secondary | ICD-10-CM | POA: Diagnosis not present

## 2020-11-17 DIAGNOSIS — I10 Essential (primary) hypertension: Secondary | ICD-10-CM | POA: Diagnosis not present

## 2020-11-17 DIAGNOSIS — Z0000 Encounter for general adult medical examination without abnormal findings: Secondary | ICD-10-CM | POA: Diagnosis not present

## 2020-11-17 DIAGNOSIS — G4730 Sleep apnea, unspecified: Secondary | ICD-10-CM | POA: Diagnosis not present

## 2020-11-17 DIAGNOSIS — N200 Calculus of kidney: Secondary | ICD-10-CM | POA: Diagnosis not present

## 2020-11-17 DIAGNOSIS — R82998 Other abnormal findings in urine: Secondary | ICD-10-CM | POA: Diagnosis not present

## 2020-11-17 DIAGNOSIS — R319 Hematuria, unspecified: Secondary | ICD-10-CM | POA: Diagnosis not present

## 2020-11-17 DIAGNOSIS — E785 Hyperlipidemia, unspecified: Secondary | ICD-10-CM | POA: Diagnosis not present

## 2020-11-17 DIAGNOSIS — J302 Other seasonal allergic rhinitis: Secondary | ICD-10-CM | POA: Diagnosis not present

## 2020-11-17 DIAGNOSIS — R7301 Impaired fasting glucose: Secondary | ICD-10-CM | POA: Diagnosis not present

## 2020-11-17 DIAGNOSIS — Z1212 Encounter for screening for malignant neoplasm of rectum: Secondary | ICD-10-CM | POA: Diagnosis not present

## 2020-11-26 DIAGNOSIS — G4733 Obstructive sleep apnea (adult) (pediatric): Secondary | ICD-10-CM | POA: Diagnosis not present

## 2020-12-16 DIAGNOSIS — G4730 Sleep apnea, unspecified: Secondary | ICD-10-CM | POA: Diagnosis not present

## 2020-12-16 DIAGNOSIS — E785 Hyperlipidemia, unspecified: Secondary | ICD-10-CM | POA: Diagnosis not present

## 2020-12-16 DIAGNOSIS — R7301 Impaired fasting glucose: Secondary | ICD-10-CM | POA: Diagnosis not present

## 2020-12-16 DIAGNOSIS — M1611 Unilateral primary osteoarthritis, right hip: Secondary | ICD-10-CM | POA: Diagnosis not present

## 2020-12-16 DIAGNOSIS — I10 Essential (primary) hypertension: Secondary | ICD-10-CM | POA: Diagnosis not present

## 2021-02-02 DIAGNOSIS — H401111 Primary open-angle glaucoma, right eye, mild stage: Secondary | ICD-10-CM | POA: Diagnosis not present

## 2021-02-02 DIAGNOSIS — H401123 Primary open-angle glaucoma, left eye, severe stage: Secondary | ICD-10-CM | POA: Diagnosis not present

## 2021-02-12 DIAGNOSIS — M25551 Pain in right hip: Secondary | ICD-10-CM | POA: Diagnosis not present

## 2021-02-12 DIAGNOSIS — M1611 Unilateral primary osteoarthritis, right hip: Secondary | ICD-10-CM | POA: Diagnosis not present

## 2021-02-27 DIAGNOSIS — G4733 Obstructive sleep apnea (adult) (pediatric): Secondary | ICD-10-CM | POA: Diagnosis not present

## 2021-03-23 DIAGNOSIS — M1611 Unilateral primary osteoarthritis, right hip: Secondary | ICD-10-CM | POA: Diagnosis not present

## 2021-04-07 DIAGNOSIS — R7301 Impaired fasting glucose: Secondary | ICD-10-CM | POA: Diagnosis not present

## 2021-04-07 DIAGNOSIS — R319 Hematuria, unspecified: Secondary | ICD-10-CM | POA: Diagnosis not present

## 2021-04-07 DIAGNOSIS — G4730 Sleep apnea, unspecified: Secondary | ICD-10-CM | POA: Diagnosis not present

## 2021-04-07 DIAGNOSIS — E785 Hyperlipidemia, unspecified: Secondary | ICD-10-CM | POA: Diagnosis not present

## 2021-04-07 DIAGNOSIS — M1611 Unilateral primary osteoarthritis, right hip: Secondary | ICD-10-CM | POA: Diagnosis not present

## 2021-04-07 DIAGNOSIS — I10 Essential (primary) hypertension: Secondary | ICD-10-CM | POA: Diagnosis not present

## 2021-04-07 DIAGNOSIS — M25551 Pain in right hip: Secondary | ICD-10-CM | POA: Diagnosis not present

## 2021-04-24 DIAGNOSIS — M1611 Unilateral primary osteoarthritis, right hip: Secondary | ICD-10-CM | POA: Diagnosis not present

## 2021-05-29 DIAGNOSIS — G4733 Obstructive sleep apnea (adult) (pediatric): Secondary | ICD-10-CM | POA: Diagnosis not present

## 2021-06-01 DIAGNOSIS — G4733 Obstructive sleep apnea (adult) (pediatric): Secondary | ICD-10-CM | POA: Diagnosis not present

## 2021-07-13 DIAGNOSIS — H34832 Tributary (branch) retinal vein occlusion, left eye, with macular edema: Secondary | ICD-10-CM | POA: Diagnosis not present

## 2021-07-13 DIAGNOSIS — H401111 Primary open-angle glaucoma, right eye, mild stage: Secondary | ICD-10-CM | POA: Diagnosis not present

## 2021-07-13 DIAGNOSIS — H401123 Primary open-angle glaucoma, left eye, severe stage: Secondary | ICD-10-CM | POA: Diagnosis not present

## 2021-07-15 DIAGNOSIS — H401111 Primary open-angle glaucoma, right eye, mild stage: Secondary | ICD-10-CM | POA: Diagnosis not present

## 2021-07-15 DIAGNOSIS — H401123 Primary open-angle glaucoma, left eye, severe stage: Secondary | ICD-10-CM | POA: Diagnosis not present

## 2021-07-15 DIAGNOSIS — H3581 Retinal edema: Secondary | ICD-10-CM | POA: Diagnosis not present

## 2021-07-15 DIAGNOSIS — H34832 Tributary (branch) retinal vein occlusion, left eye, with macular edema: Secondary | ICD-10-CM | POA: Diagnosis not present

## 2021-07-15 DIAGNOSIS — Z961 Presence of intraocular lens: Secondary | ICD-10-CM | POA: Diagnosis not present

## 2021-07-17 DIAGNOSIS — L814 Other melanin hyperpigmentation: Secondary | ICD-10-CM | POA: Diagnosis not present

## 2021-07-17 DIAGNOSIS — L821 Other seborrheic keratosis: Secondary | ICD-10-CM | POA: Diagnosis not present

## 2021-07-17 DIAGNOSIS — Z85828 Personal history of other malignant neoplasm of skin: Secondary | ICD-10-CM | POA: Diagnosis not present

## 2021-07-17 DIAGNOSIS — L570 Actinic keratosis: Secondary | ICD-10-CM | POA: Diagnosis not present

## 2021-07-17 DIAGNOSIS — C44519 Basal cell carcinoma of skin of other part of trunk: Secondary | ICD-10-CM | POA: Diagnosis not present

## 2021-07-20 DIAGNOSIS — M25521 Pain in right elbow: Secondary | ICD-10-CM | POA: Diagnosis not present

## 2021-07-20 DIAGNOSIS — M66822 Spontaneous rupture of other tendons, left upper arm: Secondary | ICD-10-CM | POA: Diagnosis not present

## 2021-07-20 DIAGNOSIS — Z4789 Encounter for other orthopedic aftercare: Secondary | ICD-10-CM | POA: Diagnosis not present

## 2021-07-20 DIAGNOSIS — M25522 Pain in left elbow: Secondary | ICD-10-CM | POA: Diagnosis not present

## 2021-08-13 DIAGNOSIS — Z23 Encounter for immunization: Secondary | ICD-10-CM | POA: Diagnosis not present

## 2021-08-13 DIAGNOSIS — M1611 Unilateral primary osteoarthritis, right hip: Secondary | ICD-10-CM | POA: Diagnosis not present

## 2021-08-13 DIAGNOSIS — E785 Hyperlipidemia, unspecified: Secondary | ICD-10-CM | POA: Diagnosis not present

## 2021-08-13 DIAGNOSIS — M25551 Pain in right hip: Secondary | ICD-10-CM | POA: Diagnosis not present

## 2021-08-13 DIAGNOSIS — F39 Unspecified mood [affective] disorder: Secondary | ICD-10-CM | POA: Diagnosis not present

## 2021-08-13 DIAGNOSIS — R7301 Impaired fasting glucose: Secondary | ICD-10-CM | POA: Diagnosis not present

## 2021-08-13 DIAGNOSIS — I10 Essential (primary) hypertension: Secondary | ICD-10-CM | POA: Diagnosis not present

## 2021-08-18 DIAGNOSIS — Z961 Presence of intraocular lens: Secondary | ICD-10-CM | POA: Diagnosis not present

## 2021-08-18 DIAGNOSIS — H401123 Primary open-angle glaucoma, left eye, severe stage: Secondary | ICD-10-CM | POA: Diagnosis not present

## 2021-08-18 DIAGNOSIS — H34832 Tributary (branch) retinal vein occlusion, left eye, with macular edema: Secondary | ICD-10-CM | POA: Diagnosis not present

## 2021-08-18 DIAGNOSIS — H401111 Primary open-angle glaucoma, right eye, mild stage: Secondary | ICD-10-CM | POA: Diagnosis not present

## 2021-09-09 ENCOUNTER — Encounter: Payer: Self-pay | Admitting: Adult Health

## 2021-09-09 ENCOUNTER — Ambulatory Visit: Payer: Medicare HMO | Admitting: Adult Health

## 2021-09-09 VITALS — BP 140/82 | HR 94 | Ht 66.0 in | Wt 270.0 lb

## 2021-09-09 DIAGNOSIS — Z9989 Dependence on other enabling machines and devices: Secondary | ICD-10-CM | POA: Diagnosis not present

## 2021-09-09 DIAGNOSIS — G4733 Obstructive sleep apnea (adult) (pediatric): Secondary | ICD-10-CM

## 2021-09-09 NOTE — Progress Notes (Signed)
PATIENT: Tommy [name] DOB: Oct 13, 1941  REASON FOR VISIT: follow up HISTORY FROM: patient  HISTORY OF PRESENT ILLNESS: Today 09/09/21: Mr. Mcglinn is a 80 y.o. male with a history of OSA on CPAP, he is here today for a follow up. Patients CPAP report  shows excellent compliance over the last 30 days. His leak was elevated at 39.4 in the 95th percentile. He has an elevated AHI with his pressure on auto setting between 8 and 20 cmH2O. Patient endorses that he has ordered supplies twice and reports they have been wrong and therefore he has been having issues with the mask not fitting properly. He denies any recent weight loss that contribute to the mask not fitting. He reports no other problems. He states his energy has been good since getting his hip fixed.      09/04/20: Mr. Holzheimer is a 80 year old male with a history of obstructive sleep apnea on CPAP.  His download indicates that he uses machine 28 out of 30 days for compliance of 93%.  He uses machine greater than 4 hours 18 days for compliance of 60%.  On average he uses his machine 5 hours and 28 minutes.  His residual AHI is 4.3 on 8 to 20 cm of water with EPR 3.  Leak in the 95th percentile is 25.5 L/min.  He reports that he has been having hip and knee pain. He is getting ready to have hip replacement and reports that keeps him for sleeping all night. He returns today for follow-up.  HISTORY  09/04/19: Mr. Betker is a 80 year old male with a history of obstructive sleep apnea on CPAP.  His download indicates that he uses machine nightly for compliance of 100%.  He uses machine greater than 4 hours 27 days for compliance of 90%.  He uses machine on average 6 hours and 32 minutes.  His residual AHI is 6.3 on 8 to 18 cm of water with EPR of 3.  His leak in the 95th percentile is 24.1 L/min.  He states that night he does feel the mask leaking.  He has tried adjusting and that sometimes will fix the leak.  He denies any new issues.  He  returns today for an evaluation.  REVIEW OF SYSTEMS: Out of a complete 14 system review of symptoms, the patient complains only of the following symptoms, and all other reviewed systems are negative.   ESS 13  ALLERGIES: Allergies  Allergen Reactions   Ozempic (0.25 Or 0.5 Mg-Dose) [Semaglutide(0.25 Or 0.5mg -Dos)] Nausea And Vomiting    Dizziness, stumble and fall     HOME MEDICATIONS: Outpatient Medications Prior to Visit  Medication Sig Dispense Refill   acetaminophen (TYLENOL) 500 MG tablet Take 1,000 mg by mouth every 6 (six) hours as needed for moderate pain or headache.     amLODipine (NORVASC) 10 MG tablet Take 10 mg by mouth every morning.     aspirin EC 81 MG tablet Take 81 mg by mouth daily.     atorvastatin (LIPITOR) 20 MG tablet Take 20 mg by mouth every evening.     brimonidine (ALPHAGAN) 0.2 % ophthalmic solution Place 1 drop into the left eye 3 (three) times daily.   11   buPROPion (WELLBUTRIN) 75 MG tablet Take 75 mg by mouth daily.     Cholecalciferol (DIALYVITE VITAMIN D 5000) 125 MCG (5000 UT) capsule Take 5,000 Units by mouth daily.     dorzolamide-timolol (COSOPT) 22.3-6.8 MG/ML ophthalmic solution Place 1 drop into  the left eye 2 (two) times daily.      fluticasone (FLONASE) 50 MCG/ACT nasal spray Place 2 sprays into the nose daily as needed for allergies.      latanoprost (XALATAN) 0.005 % ophthalmic solution Place 1 drop into the left eye at bedtime.     magnesium citrate SOLN Take 0.5 Bottles by mouth daily as needed for moderate constipation.     methocarbamol (ROBAXIN) 500 MG tablet Take 1 tablet (500 mg total) by mouth every 8 (eight) hours as needed for muscle spasms. 40 tablet 0   Multiple Vitamin (MULTIVITAMIN) tablet Take 1 tablet daily by mouth.     naproxen sodium (ALEVE) 220 MG tablet Take 220 mg by mouth daily as needed (pain).      nystatin-triamcinolone (MYCOLOG II) cream Apply 1 application topically daily as needed (rash).      ondansetron  (ZOFRAN-ODT) 8 MG disintegrating tablet Take 8 mg by mouth every 8 (eight) hours as needed for nausea/vomiting.     OVER THE COUNTER MEDICATION Take 1 capsule by mouth daily. Beetroot otc supplement     oxyCODONE (ROXICODONE) 5 MG immediate release tablet Take 2 tablets (10 mg total) by mouth every 4 (four) hours as needed for severe pain. 40 tablet 0   tamsulosin (FLOMAX) 0.4 MG CAPS capsule Take 0.4 mg by mouth at bedtime.      telmisartan (MICARDIS) 80 MG tablet Take 80 mg by mouth at bedtime.      venlafaxine XR (EFFEXOR-XR) 75 MG 24 hr capsule Take 150 mg by mouth daily.      vitamin E 400 UNIT capsule Take 400 Units daily by mouth.     zinc gluconate 50 MG tablet Take 50 mg by mouth daily.     No facility-administered medications prior to visit.    PAST MEDICAL HISTORY: Past Medical History:  Diagnosis Date   Arthritis    Eye problem    treating for "blood clot behind the retina" in L eye   History of kidney stones    Hyperlipemia    Hypertension    Hypoxemia 09/02/2014   OSA on CPAP    PLMD (periodic limb movement disorder) 09/02/2014   Seasonal allergies    Sleep apnea    uses a cpap    PAST SURGICAL HISTORY: Past Surgical History:  Procedure Laterality Date   BUNIONECTOMY Right 01/03/2014   Procedure: RIGHT LAPIDUS BUNION CORRECTION;  Surgeon: Wylene Simmer, MD;  Location: [facility];  Service: Orthopedics;  Laterality: Right;   JOINT REPLACEMENT     bilateral knee replacemts   JOINT REPLACEMENT     bilateral shoulder replacemts   KNEE ARTHROSCOPY     right and left   SHOULDER ARTHROSCOPY  12/13/2009   right   TOTAL HIP ARTHROPLASTY Right    TOTAL KNEE ARTHROPLASTY  12/14/2003   right   TOTAL KNEE ARTHROPLASTY  12/13/2002   left   TOTAL SHOULDER ARTHROPLASTY  12/13/2010   left   TRICEPS TENDON REPAIR Left 05/02/2020   Procedure: Left triceps tendon repair reconstruction as necessary with bursectomy and ulnar nerve release at elbow;  Surgeon:  Roseanne Kaufman, MD;  Location: [facility];  Service: Orthopedics;  Laterality: Left;  2 hrs    FAMILY HISTORY: Family History  Problem Relation Age of Onset   Glaucoma Father    Alzheimer's disease Mother    Dementia Mother    Heart Problems Maternal Grandfather    Heart Problems Maternal Grandmother  SOCIAL HISTORY: Social History   Socioeconomic History   Marital status: Married    Spouse name: Vaughan Basta   Number of children: 4   Years of education: 13   Highest education level: Not on file  Occupational History   Not on file  Tobacco Use   Smoking status: Former    Types: Cigarettes    Quit date: 01/20/1983    Years since quitting: 38.6   Smokeless tobacco: Never  Vaping Use   Vaping Use: Never used  Substance and Sexual Activity   Alcohol use: No    Alcohol/week: 0.0 standard drinks   Drug use: No   Sexual activity: Not on file  Other Topics Concern   Not on file  Social History Narrative   Patient lives at home alone and he is widowed.   Patient has four adult children.   Patient is retired.   Patient has a college education.   Patient is right-handed.   Patient drinks three-four sodas per week.   Social Determinants of Health   Financial Resource Strain: Not on file  Food Insecurity: Not on file  Transportation Needs: Not on file  Physical Activity: Not on file  Stress: Not on file  Social Connections: Not on file  Intimate Partner Violence: Not on file      PHYSICAL EXAM  Vitals:   09/09/21 1018  BP: 140/82  Pulse: 94  Weight: 270 lb (122.5 kg)  Height: 5\' 6"  (1.676 m)   Body mass index is 43.58 kg/m.  Generalized: Well developed, in no acute distress  Chest: Lungs clear to auscultation bilaterally  Neurological examination  Mentation: Alert oriented to time, place, history taking. Follows all commands speech and language fluent Cranial nerve II-XII: Extraocular movements were full, visual field were full on confrontational test Head  turning and shoulder shrug  were normal and symmetric. Motor: The motor testing reveals 5 over 5 strength of all 4 extremities. Good symmetric motor tone is noted throughout.  Sensory: Sensory testing is intact to soft touch on all 4 extremities. No evidence of extinction is noted.  Gait and station: Gait is normal.    DIAGNOSTIC DATA (LABS, IMAGING, TESTING) - I reviewed patient records, labs, notes, testing and imaging myself where available.  Lab Results  Component Value Date   WBC 11.3 (H) 10/03/2020   HGB 15.4 10/03/2020   HCT 46.0 10/03/2020   MCV 90.4 10/03/2020   PLT 263 10/03/2020      Component Value Date/Time   NA 138 10/03/2020 1457   K 4.1 10/03/2020 1457   CL 100 10/03/2020 1457   CO2 28 10/03/2020 1457   GLUCOSE 103 (H) 10/03/2020 1457   BUN 18 10/03/2020 1457   CREATININE 1.19 10/03/2020 1457   CALCIUM 9.0 10/03/2020 1457   PROT 7.5 10/04/2011 0957   ALBUMIN 4.0 10/04/2011 0957   AST 22 10/04/2011 0957   ALT 21 10/04/2011 0957   ALKPHOS 67 10/04/2011 0957   BILITOT 0.4 10/04/2011 0957   GFRNONAA >60 10/03/2020 1457   GFRAA >60 05/02/2020 1225     ASSESSMENT AND PLAN 80 y.o. year old male  has a past medical history of Arthritis, Eye problem, History of kidney stones, Hyperlipemia, Hypertension, Hypoxemia (09/02/2014), OSA on CPAP, PLMD (periodic limb movement disorder) (09/02/2014), Seasonal allergies, and Sleep apnea. here with:  OSA on CPAP  - CPAP compliance excellent - Poor treatment of AHI, mask fitting ordered. - Encourage patient to use CPAP nightly and > 4 hours each  night - F/U in 6 months or sooner if needed    Ward Givens, MSN, NP-C 09/09/2021, 10:23 AM [facility] [address], [place], [place] [address] [phone]

## 2021-09-15 DIAGNOSIS — H401111 Primary open-angle glaucoma, right eye, mild stage: Secondary | ICD-10-CM | POA: Diagnosis not present

## 2021-09-15 DIAGNOSIS — H34832 Tributary (branch) retinal vein occlusion, left eye, with macular edema: Secondary | ICD-10-CM | POA: Diagnosis not present

## 2021-09-15 DIAGNOSIS — Z961 Presence of intraocular lens: Secondary | ICD-10-CM | POA: Diagnosis not present

## 2021-09-15 DIAGNOSIS — H401123 Primary open-angle glaucoma, left eye, severe stage: Secondary | ICD-10-CM | POA: Diagnosis not present

## 2021-09-16 DIAGNOSIS — G4733 Obstructive sleep apnea (adult) (pediatric): Secondary | ICD-10-CM | POA: Diagnosis not present

## 2021-09-17 DIAGNOSIS — R7301 Impaired fasting glucose: Secondary | ICD-10-CM | POA: Diagnosis not present

## 2021-09-17 DIAGNOSIS — I10 Essential (primary) hypertension: Secondary | ICD-10-CM | POA: Diagnosis not present

## 2021-09-17 DIAGNOSIS — E785 Hyperlipidemia, unspecified: Secondary | ICD-10-CM | POA: Diagnosis not present

## 2021-10-12 DIAGNOSIS — H34832 Tributary (branch) retinal vein occlusion, left eye, with macular edema: Secondary | ICD-10-CM | POA: Diagnosis not present

## 2021-10-12 DIAGNOSIS — H401111 Primary open-angle glaucoma, right eye, mild stage: Secondary | ICD-10-CM | POA: Diagnosis not present

## 2021-10-12 DIAGNOSIS — H401123 Primary open-angle glaucoma, left eye, severe stage: Secondary | ICD-10-CM | POA: Diagnosis not present

## 2021-10-13 DIAGNOSIS — H401123 Primary open-angle glaucoma, left eye, severe stage: Secondary | ICD-10-CM | POA: Diagnosis not present

## 2021-10-13 DIAGNOSIS — Z961 Presence of intraocular lens: Secondary | ICD-10-CM | POA: Diagnosis not present

## 2021-10-13 DIAGNOSIS — H34832 Tributary (branch) retinal vein occlusion, left eye, with macular edema: Secondary | ICD-10-CM | POA: Diagnosis not present

## 2021-10-13 DIAGNOSIS — H401111 Primary open-angle glaucoma, right eye, mild stage: Secondary | ICD-10-CM | POA: Diagnosis not present

## 2021-10-17 DIAGNOSIS — G4733 Obstructive sleep apnea (adult) (pediatric): Secondary | ICD-10-CM | POA: Diagnosis not present

## 2021-11-10 DIAGNOSIS — H34832 Tributary (branch) retinal vein occlusion, left eye, with macular edema: Secondary | ICD-10-CM | POA: Diagnosis not present

## 2021-11-10 DIAGNOSIS — Z961 Presence of intraocular lens: Secondary | ICD-10-CM | POA: Diagnosis not present

## 2021-11-10 DIAGNOSIS — H401111 Primary open-angle glaucoma, right eye, mild stage: Secondary | ICD-10-CM | POA: Diagnosis not present

## 2021-11-10 DIAGNOSIS — H401123 Primary open-angle glaucoma, left eye, severe stage: Secondary | ICD-10-CM | POA: Diagnosis not present

## 2021-12-17 DIAGNOSIS — I10 Essential (primary) hypertension: Secondary | ICD-10-CM | POA: Diagnosis not present

## 2021-12-17 DIAGNOSIS — E785 Hyperlipidemia, unspecified: Secondary | ICD-10-CM | POA: Diagnosis not present

## 2021-12-17 DIAGNOSIS — R7301 Impaired fasting glucose: Secondary | ICD-10-CM | POA: Diagnosis not present

## 2021-12-18 DIAGNOSIS — I10 Essential (primary) hypertension: Secondary | ICD-10-CM | POA: Diagnosis not present

## 2021-12-18 DIAGNOSIS — E785 Hyperlipidemia, unspecified: Secondary | ICD-10-CM | POA: Diagnosis not present

## 2021-12-18 DIAGNOSIS — R7301 Impaired fasting glucose: Secondary | ICD-10-CM | POA: Diagnosis not present

## 2021-12-18 DIAGNOSIS — Z125 Encounter for screening for malignant neoplasm of prostate: Secondary | ICD-10-CM | POA: Diagnosis not present

## 2021-12-25 DIAGNOSIS — R82998 Other abnormal findings in urine: Secondary | ICD-10-CM | POA: Diagnosis not present

## 2021-12-25 DIAGNOSIS — R7301 Impaired fasting glucose: Secondary | ICD-10-CM | POA: Diagnosis not present

## 2021-12-25 DIAGNOSIS — H409 Unspecified glaucoma: Secondary | ICD-10-CM | POA: Diagnosis not present

## 2021-12-25 DIAGNOSIS — F39 Unspecified mood [affective] disorder: Secondary | ICD-10-CM | POA: Diagnosis not present

## 2021-12-25 DIAGNOSIS — G4730 Sleep apnea, unspecified: Secondary | ICD-10-CM | POA: Diagnosis not present

## 2021-12-25 DIAGNOSIS — E785 Hyperlipidemia, unspecified: Secondary | ICD-10-CM | POA: Diagnosis not present

## 2021-12-25 DIAGNOSIS — K5900 Constipation, unspecified: Secondary | ICD-10-CM | POA: Diagnosis not present

## 2021-12-25 DIAGNOSIS — I10 Essential (primary) hypertension: Secondary | ICD-10-CM | POA: Diagnosis not present

## 2021-12-25 DIAGNOSIS — Z0000 Encounter for general adult medical examination without abnormal findings: Secondary | ICD-10-CM | POA: Diagnosis not present

## 2022-01-06 DIAGNOSIS — Z1211 Encounter for screening for malignant neoplasm of colon: Secondary | ICD-10-CM | POA: Diagnosis not present

## 2022-01-07 DIAGNOSIS — H401123 Primary open-angle glaucoma, left eye, severe stage: Secondary | ICD-10-CM | POA: Diagnosis not present

## 2022-01-07 DIAGNOSIS — H401111 Primary open-angle glaucoma, right eye, mild stage: Secondary | ICD-10-CM | POA: Diagnosis not present

## 2022-01-14 LAB — COLOGUARD: COLOGUARD: NEGATIVE

## 2022-01-26 DIAGNOSIS — Z961 Presence of intraocular lens: Secondary | ICD-10-CM | POA: Diagnosis not present

## 2022-01-26 DIAGNOSIS — H401111 Primary open-angle glaucoma, right eye, mild stage: Secondary | ICD-10-CM | POA: Diagnosis not present

## 2022-01-26 DIAGNOSIS — H401123 Primary open-angle glaucoma, left eye, severe stage: Secondary | ICD-10-CM | POA: Diagnosis not present

## 2022-01-26 DIAGNOSIS — H34832 Tributary (branch) retinal vein occlusion, left eye, with macular edema: Secondary | ICD-10-CM | POA: Diagnosis not present

## 2022-03-09 DIAGNOSIS — H401123 Primary open-angle glaucoma, left eye, severe stage: Secondary | ICD-10-CM | POA: Diagnosis not present

## 2022-03-09 DIAGNOSIS — Z961 Presence of intraocular lens: Secondary | ICD-10-CM | POA: Diagnosis not present

## 2022-03-09 DIAGNOSIS — H34832 Tributary (branch) retinal vein occlusion, left eye, with macular edema: Secondary | ICD-10-CM | POA: Diagnosis not present

## 2022-03-09 DIAGNOSIS — H401111 Primary open-angle glaucoma, right eye, mild stage: Secondary | ICD-10-CM | POA: Diagnosis not present

## 2022-03-10 ENCOUNTER — Ambulatory Visit: Payer: Medicare HMO | Admitting: Adult Health

## 2022-03-10 ENCOUNTER — Encounter: Payer: Self-pay | Admitting: Adult Health

## 2022-03-10 VITALS — BP 110/72 | HR 76 | Ht 66.0 in | Wt 258.4 lb

## 2022-03-10 DIAGNOSIS — Z9989 Dependence on other enabling machines and devices: Secondary | ICD-10-CM

## 2022-03-10 DIAGNOSIS — G4733 Obstructive sleep apnea (adult) (pediatric): Secondary | ICD-10-CM | POA: Diagnosis not present

## 2022-03-10 NOTE — Progress Notes (Signed)
? ? ?PATIENT: Tommy [name] ?DOB: Nov 26, 1941 ? ?REASON FOR VISIT: follow up ?HISTORY FROM: patient ?PRIMARY NEUROLOGIST: Dr. Brett Fairy ? ?Chief Complaint  ?Patient presents with  ? Follow-up  ?  Pt in 20  pt is here CPAP follow up  . Pt states he has  no questions or concerns for this visit   ? ? ? ?HISTORY OF PRESENT ILLNESS: ?Today 03/10/22: ?Tommy [name] is a 81 year old male with a history of OSA on CPAP.  Returns today for follow-up.  He states that he tries to use his machine nightly.  He was in [place] recently and the power went out so he was not bale to use >4 hours. Does feel the mask leaking if he doesn't sleep supine. Currently has a full facemask. ? ? ? ?REVIEW OF SYSTEMS: Out of a complete 14 system review of symptoms, the patient complains only of the following symptoms, and all other reviewed systems are negative. ? ?ESS 11 ? ?ALLERGIES: ?Allergies  ?Allergen Reactions  ? Ozempic (0.25 Or 0.5 Mg-Dose) [Semaglutide(0.25 Or 0.'5mg'$ -Dos)] Nausea And Vomiting  ?  Dizziness, stumble and fall   ? ? ?HOME MEDICATIONS: ?Outpatient Medications Prior to Visit  ?Medication Sig Dispense Refill  ? acetaminophen (TYLENOL) 500 MG tablet Take 1,000 mg by mouth [name] 6 (six) hours as needed for moderate pain or headache.    ? amLODipine (NORVASC) 10 MG tablet Take 10 mg by mouth [name] morning.    ? aspirin EC 81 MG tablet Take 81 mg by mouth daily.    ? atorvastatin (LIPITOR) 20 MG tablet Take 20 mg by mouth [name] evening.    ? brimonidine (ALPHAGAN) 0.2 % ophthalmic solution Place 1 drop into the left eye 3 (three) times daily.   11  ? buPROPion (WELLBUTRIN) 75 MG tablet Take 75 mg by mouth daily.    ? Cholecalciferol (DIALYVITE VITAMIN D 5000) 125 MCG (5000 UT) capsule Take 5,000 Units by mouth daily.    ? dorzolamide-timolol (COSOPT) 22.3-6.8 MG/ML ophthalmic solution Place 1 drop into the left eye 2 (two) times daily.     ? fluticasone (FLONASE) 50 MCG/ACT nasal spray Place 2 sprays into the nose daily as needed  for allergies.     ? latanoprost (XALATAN) 0.005 % ophthalmic solution Place 1 drop into the left eye at bedtime.    ? magnesium citrate SOLN Take 0.5 Bottles by mouth daily as needed for moderate constipation.    ? methocarbamol (ROBAXIN) 500 MG tablet Take 1 tablet (500 mg total) by mouth [name] 8 (eight) hours as needed for muscle spasms. 40 tablet 0  ? Multiple Vitamin (MULTIVITAMIN) tablet Take 1 tablet daily by mouth.    ? naproxen sodium (ALEVE) 220 MG tablet Take 220 mg by mouth daily as needed (pain).     ? nystatin-triamcinolone (MYCOLOG II) cream Apply 1 application topically daily as needed (rash).     ? ondansetron (ZOFRAN-ODT) 8 MG disintegrating tablet Take 8 mg by mouth [name] 8 (eight) hours as needed for nausea/vomiting.    ? OVER THE COUNTER MEDICATION Take 1 capsule by mouth daily. Beetroot otc supplement    ? oxyCODONE (ROXICODONE) 5 MG immediate release tablet Take 2 tablets (10 mg total) by mouth [name] 4 (four) hours as needed for severe pain. 40 tablet 0  ? tamsulosin (FLOMAX) 0.4 MG CAPS capsule Take 0.4 mg by mouth at bedtime.     ? telmisartan (MICARDIS) 80 MG tablet Take 80 mg by mouth at bedtime.     ?  venlafaxine XR (EFFEXOR-XR) 75 MG 24 hr capsule Take 150 mg by mouth daily.     ? vitamin E 400 UNIT capsule Take 400 Units daily by mouth.    ? zinc gluconate 50 MG tablet Take 50 mg by mouth daily.    ? ?No facility-administered medications prior to visit.  ? ? ?PAST MEDICAL HISTORY: ?Past Medical History:  ?Diagnosis Date  ? Arthritis   ? Eye problem   ? treating for "blood clot behind the retina" in L eye  ? History of kidney stones   ? Hyperlipemia   ? Hypertension   ? Hypoxemia 09/02/2014  ? OSA on CPAP   ? PLMD (periodic limb movement disorder) 09/02/2014  ? Seasonal allergies   ? Sleep apnea   ? uses a cpap  ? ? ?PAST SURGICAL HISTORY: ?Past Surgical History:  ?Procedure Laterality Date  ? BUNIONECTOMY Right 01/03/2014  ? Procedure: RIGHT LAPIDUS BUNION CORRECTION;  Surgeon: Wylene Simmer, MD;  Location: [facility];  Service: Orthopedics;  Laterality: Right;  ? JOINT REPLACEMENT    ? bilateral knee replacemts  ? JOINT REPLACEMENT    ? bilateral shoulder replacemts  ? KNEE ARTHROSCOPY    ? right and left  ? SHOULDER ARTHROSCOPY  12/13/2009  ? right  ? TOTAL HIP ARTHROPLASTY Right   ? TOTAL KNEE ARTHROPLASTY  12/14/2003  ? right  ? TOTAL KNEE ARTHROPLASTY  12/13/2002  ? left  ? TOTAL SHOULDER ARTHROPLASTY  12/13/2010  ? left  ? TRICEPS TENDON REPAIR Left 05/02/2020  ? Procedure: Left triceps tendon repair reconstruction as necessary with bursectomy and ulnar nerve release at elbow;  Surgeon: Roseanne Kaufman, MD;  Location: [facility];  Service: Orthopedics;  Laterality: Left;  2 hrs  ? ? ?FAMILY HISTORY: ?Family History  ?Problem Relation Age of Onset  ? Alzheimer's disease Mother   ? Dementia Mother   ? Glaucoma Father   ? Heart Problems Maternal Grandmother   ? Heart Problems Maternal Grandfather   ? Sleep apnea Neg Hx   ? ? ?SOCIAL HISTORY: ?Social History  ? ?Socioeconomic History  ? Marital status: Widowed  ?  Spouse name: Ivin Booty  ? Number of children: 4  ? Years of education: 70  ? Highest education level: Not on file  ?Occupational History  ? Not on file  ?Tobacco Use  ? Smoking status: Former  ?  Types: Cigarettes  ?  Quit date: 01/20/1983  ?  Years since quitting: 39.1  ? Smokeless tobacco: Never  ?Vaping Use  ? Vaping Use: Never used  ?Substance and Sexual Activity  ? Alcohol use: No  ?  Alcohol/week: 0.0 standard drinks  ? Drug use: No  ? Sexual activity: Not on file  ?Other Topics Concern  ? Not on file  ?Social History Narrative  ? Patient has remarried to [place] after former wife, Vaughan Basta, passed.  ? Patient has four adult children.  ? Patient is retired.  ? Patient has a college education.  ? Patient is right-handed.  ? Patient drinks a max of 2 sodas per week.  ? ?Social Determinants of Health  ? ?Financial Resource Strain: Not on file  ?Food Insecurity: Not on file   ?Transportation Needs: Not on file  ?Physical Activity: Not on file  ?Stress: Not on file  ?Social Connections: Not on file  ?Intimate Partner Violence: Not on file  ? ? ? ? ?PHYSICAL EXAM ? ?Vitals:  ? 03/10/22 1037  ?BP: 110/72  ?Pulse: 76  ?  Weight: 258 lb 6.4 oz (117.2 kg)  ?Height: '5\' 6"'$  (1.676 m)  ? ?Body mass index is 41.71 kg/m?. ? ?Generalized: Well developed, in no acute distress  ?Chest: Lungs clear to auscultation bilaterally ? ?Neurological examination  ?Mentation: Alert oriented to time, place, history taking. Follows all commands speech and language fluent ?Cranial nerve II-XII: Extraocular movements were full, visual field were full on confrontational test Head turning and shoulder shrug  were normal and symmetric. ?Motor: The motor testing reveals 5 over 5 strength of all 4 extremities. Good symmetric motor tone is noted throughout.  ?Sensory: Sensory testing is intact to soft touch on all 4 extremities. No evidence of extinction is noted.  ?Gait and station: Gait is normal.  ? ? ?DIAGNOSTIC DATA (LABS, IMAGING, TESTING) ?- I reviewed patient records, labs, notes, testing and imaging myself where available. ? ?Lab Results  ?Component Value Date  ? WBC 11.3 (H) 10/03/2020  ? HGB 15.4 10/03/2020  ? HCT 46.0 10/03/2020  ? MCV 90.4 10/03/2020  ? PLT 263 10/03/2020  ? ?   ?Component Value Date/Time  ? NA 138 10/03/2020 1457  ? K 4.1 10/03/2020 1457  ? CL 100 10/03/2020 1457  ? CO2 28 10/03/2020 1457  ? GLUCOSE 103 (H) 10/03/2020 1457  ? BUN 18 10/03/2020 1457  ? CREATININE 1.19 10/03/2020 1457  ? CALCIUM 9.0 10/03/2020 1457  ? PROT 7.5 10/04/2011 0957  ? ALBUMIN 4.0 10/04/2011 0957  ? AST 22 10/04/2011 0957  ? ALT 21 10/04/2011 0957  ? ALKPHOS 67 10/04/2011 0957  ? BILITOT 0.4 10/04/2011 0957  ? GFRNONAA >60 10/03/2020 1457  ? GFRAA >60 05/02/2020 1225  ? ? ? ? ? ?ASSESSMENT AND PLAN ?81 y.o. year old male  has a past medical history of Arthritis, Eye problem, History of kidney stones, Hyperlipemia,  Hypertension, Hypoxemia (09/02/2014), OSA on CPAP, PLMD (periodic limb movement disorder) (09/02/2014), Seasonal allergies, and Sleep apnea. here with: ? ?OSA on CPAP ? ?- CPAP compliance excellent ?- Good treatmen

## 2022-03-10 NOTE — Patient Instructions (Signed)
Continue using CPAP nightly and greater than 4 hours each night °If your symptoms worsen or you develop new symptoms please let us know.  ° °

## 2022-04-05 DIAGNOSIS — E785 Hyperlipidemia, unspecified: Secondary | ICD-10-CM | POA: Diagnosis not present

## 2022-04-05 DIAGNOSIS — N182 Chronic kidney disease, stage 2 (mild): Secondary | ICD-10-CM | POA: Diagnosis not present

## 2022-04-05 DIAGNOSIS — I10 Essential (primary) hypertension: Secondary | ICD-10-CM | POA: Diagnosis not present

## 2022-04-05 DIAGNOSIS — R7301 Impaired fasting glucose: Secondary | ICD-10-CM | POA: Diagnosis not present

## 2022-04-07 DIAGNOSIS — H401111 Primary open-angle glaucoma, right eye, mild stage: Secondary | ICD-10-CM | POA: Diagnosis not present

## 2022-04-07 DIAGNOSIS — H401123 Primary open-angle glaucoma, left eye, severe stage: Secondary | ICD-10-CM | POA: Diagnosis not present

## 2022-06-07 ENCOUNTER — Telehealth: Payer: Self-pay | Admitting: Cardiology

## 2022-06-07 DIAGNOSIS — M1611 Unilateral primary osteoarthritis, right hip: Secondary | ICD-10-CM | POA: Diagnosis not present

## 2022-06-07 DIAGNOSIS — R9431 Abnormal electrocardiogram [ECG] [EKG]: Secondary | ICD-10-CM | POA: Diagnosis not present

## 2022-06-07 DIAGNOSIS — E785 Hyperlipidemia, unspecified: Secondary | ICD-10-CM | POA: Diagnosis not present

## 2022-06-07 DIAGNOSIS — I4892 Unspecified atrial flutter: Secondary | ICD-10-CM | POA: Diagnosis not present

## 2022-06-07 DIAGNOSIS — I10 Essential (primary) hypertension: Secondary | ICD-10-CM | POA: Diagnosis not present

## 2022-06-07 DIAGNOSIS — F39 Unspecified mood [affective] disorder: Secondary | ICD-10-CM | POA: Diagnosis not present

## 2022-06-07 DIAGNOSIS — H34832 Tributary (branch) retinal vein occlusion, left eye, with macular edema: Secondary | ICD-10-CM | POA: Diagnosis not present

## 2022-06-07 DIAGNOSIS — G4730 Sleep apnea, unspecified: Secondary | ICD-10-CM | POA: Diagnosis not present

## 2022-06-07 DIAGNOSIS — R7301 Impaired fasting glucose: Secondary | ICD-10-CM | POA: Diagnosis not present

## 2022-06-10 NOTE — Telephone Encounter (Signed)
Patient has been scheduled for return office visit.

## 2022-06-18 ENCOUNTER — Encounter: Payer: Self-pay | Admitting: Cardiology

## 2022-06-18 ENCOUNTER — Ambulatory Visit: Payer: Medicare HMO | Admitting: Cardiology

## 2022-06-18 VITALS — BP 139/85 | HR 88 | Temp 98.0°F | Resp 16 | Ht 66.0 in | Wt 256.0 lb

## 2022-06-18 DIAGNOSIS — I484 Atypical atrial flutter: Secondary | ICD-10-CM | POA: Diagnosis not present

## 2022-06-18 DIAGNOSIS — Z9989 Dependence on other enabling machines and devices: Secondary | ICD-10-CM | POA: Diagnosis not present

## 2022-06-18 DIAGNOSIS — I10 Essential (primary) hypertension: Secondary | ICD-10-CM

## 2022-06-18 DIAGNOSIS — G4733 Obstructive sleep apnea (adult) (pediatric): Secondary | ICD-10-CM

## 2022-06-18 NOTE — Progress Notes (Signed)
Patient referred by Chilton Greathouse, MD for atrial flutter  Subjective:   Tommy [name], male    DOB: [birth year], 81 y.o.   MRN: [id number]   Chief Complaint  Patient presents with   Atrial Flutter   New Patient (Initial Visit)     HPI  81 y.o. Caucasian male with hypertension, hyperlipidemia, obesity, OSA on CPAP, referred for management of atrial flutter.  Patient is here with his wife. He is retired, stays active with work around the house but does not do any regular exercise. He was recently found to have atrial flutter on routine visit with his PCP Dr. Felipa Eth. He denies chest pain, shortness of breath, palpitations, leg edema, orthopnea, PND, TIA/syncope.   He has OSA, uses CPAP regularly. 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   Past Medical History:  Diagnosis Date   Arthritis    Eye problem    treating for "blood clot behind the retina" in L eye   History of kidney stones    Hyperlipemia    Hypertension    Hypoxemia 09/02/2014   OSA on CPAP    PLMD (periodic limb movement disorder) 09/02/2014   Seasonal allergies    Sleep apnea    uses a cpap     Past Surgical History:  Procedure Laterality Date   BUNIONECTOMY Right 01/03/2014   Procedure: RIGHT LAPIDUS BUNION CORRECTION;  Surgeon: Toni Arthurs, MD;  Location: [facility];  Service: Orthopedics;  Laterality: Right;   JOINT REPLACEMENT     bilateral knee replacemts   JOINT REPLACEMENT     bilateral shoulder replacemts   KNEE ARTHROSCOPY     right and left   SHOULDER ARTHROSCOPY  12/13/2009   right   TOTAL HIP ARTHROPLASTY Right    TOTAL KNEE ARTHROPLASTY  12/14/2003   right   TOTAL KNEE ARTHROPLASTY  12/13/2002   left   TOTAL SHOULDER ARTHROPLASTY  12/13/2010   left   TRICEPS TENDON REPAIR Left 05/02/2020   Procedure: Left triceps tendon repair reconstruction as necessary with  bursectomy and ulnar nerve release at elbow;  Surgeon: Dominica Severin, MD;  Location: [facility];  Service: Orthopedics;  Laterality: Left;  2 hrs     Social History   Tobacco Use  Smoking Status Former   Packs/day: 0.50   Years: 8.00   Total pack years: 4.00   Types: Cigarettes   Quit date: 01/20/1983   Years since quitting: 39.4  Smokeless Tobacco Never    Social History   Substance and Sexual Activity  Alcohol Use No   Alcohol/week: 0.0 standard drinks of alcohol     Family History  Problem Relation Age of Onset   Alzheimer's disease Mother    Dementia Mother    Glaucoma Father    Heart Problems Maternal Grandmother    Heart Problems Maternal Grandfather    Sleep apnea Neg Hx       Current Outpatient Medications:    acetaminophen (TYLENOL) 500 MG tablet, Take 1,000 mg by mouth every 6 (six) hours as needed for moderate pain or headache., Disp: , Rfl:    amLODipine (NORVASC) 10 MG tablet, Take 10 mg by mouth every morning., Disp: , Rfl:    atorvastatin (LIPITOR) 20 MG tablet, Take 20 mg by mouth every evening., Disp: , Rfl:    brimonidine (ALPHAGAN) 0.2 % ophthalmic solution, Place 1  drop into the left eye 3 (three) times daily. , Disp: , Rfl: 11   buPROPion (WELLBUTRIN) 75 MG tablet, Take 75 mg by mouth daily., Disp: , Rfl:    Cholecalciferol (DIALYVITE VITAMIN D 5000) 125 MCG (5000 UT) capsule, Take 5,000 Units by mouth daily., Disp: , Rfl:    dorzolamide-timolol (COSOPT) 22.3-6.8 MG/ML ophthalmic solution, Place 1 drop into the left eye 2 (two) times daily. , Disp: , Rfl:    ELIQUIS 5 MG TABS tablet, Take 5 mg by mouth 2 (two) times daily., Disp: , Rfl:    fluticasone (FLONASE) 50 MCG/ACT nasal spray, Place 2 sprays into the nose daily as needed for allergies. , Disp: , Rfl:    latanoprost (XALATAN) 0.005 % ophthalmic solution, Place 1 drop into the left eye at bedtime., Disp: , Rfl:    methocarbamol (ROBAXIN) 500 MG tablet, Take 1 tablet (500 mg total) by mouth every 8  (eight) hours as needed for muscle spasms., Disp: 40 tablet, Rfl: 0   MOUNJARO 5 MG/0.5ML Pen, SMARTSIG:5 Milligram(s) SUB-Q Once a Week, Disp: , Rfl:    Multiple Vitamin (MULTIVITAMIN) tablet, Take 1 tablet daily by mouth., Disp: , Rfl:    naproxen sodium (ALEVE) 220 MG tablet, Take 220 mg by mouth daily as needed (pain). , Disp: , Rfl:    nystatin-triamcinolone (MYCOLOG II) cream, Apply 1 application topically daily as needed (rash). , Disp: , Rfl:    tamsulosin (FLOMAX) 0.4 MG CAPS capsule, Take 0.4 mg by mouth at bedtime. , Disp: , Rfl:    telmisartan (MICARDIS) 80 MG tablet, Take 80 mg by mouth at bedtime. , Disp: , Rfl:    venlafaxine XR (EFFEXOR-XR) 75 MG 24 hr capsule, Take 150 mg by mouth daily. , Disp: , Rfl:    vitamin E 400 UNIT capsule, Take 400 Units daily by mouth., Disp: , Rfl:    zinc gluconate 50 MG tablet, Take 50 mg by mouth daily., Disp: , Rfl:    Cardiovascular and other pertinent studies:  Reviewed external labs and tests, independently interpreted  EKG 06/18/2022: Atrial flutter 92 bpm Incomplete right bundle branch block Nonspecific ST-T changes   Recent labs: Jan-April 2023: Glucose NA, BUN/Cr 14/1.1. EGFR 58. HbA1C 5.5% Chol 169, TG 194, HDL 29, LDL 101 TSH 1.1 normal    Review of Systems  Cardiovascular:  Negative for chest pain, dyspnea on exertion, leg swelling, palpitations and syncope.         Vitals:   06/18/22 0837 06/18/22 0846  BP: (!) 141/87 139/85  Pulse: 80 88  Resp: 16   Temp: 98 F (36.7 C)   SpO2: 95%      Body mass index is 41.32 kg/m. Filed Weights   06/18/22 0837  Weight: 256 lb (116.1 kg)     Objective:   Physical Exam Vitals and nursing note reviewed.  Constitutional:      General: He is not in acute distress.    Appearance: He is obese.  Neck:     Vascular: No JVD.  Cardiovascular:     Rate and Rhythm: Normal rate and regular rhythm.     Heart sounds: Normal heart sounds. No murmur heard. Pulmonary:      Effort: Pulmonary effort is normal.     Breath sounds: Normal breath sounds. No wheezing or rales.  Musculoskeletal:     Right lower leg: No edema.     Left lower leg: No edema.        Visit diagnoses:  ICD-10-CM   1. Atypical atrial flutter (HCC)  I48.4 EKG 12-Lead    PCV ECHOCARDIOGRAM COMPLETE    2. Essential hypertension  I10     3. OSA on CPAP  G47.33    Z99.89        Orders Placed This Encounter  Procedures   EKG 12-Lead   PCV ECHOCARDIOGRAM COMPLETE      Assessment & Recommendations:   81 y.o. Caucasian male with hypertension, hyperlipidemia, obesity, OSA on CPAP, referred for management of atrial flutter.  Atrial flutter: Incidental finding. Rate controlled. Asymptomatic.  Risk factors including hypertension, obesity, OSA. Possible prior occular stroke. Even though atrial flutter was not previously seen on EKG, I wonder if this could have been related to paroxysmal atrial flutter then, which may have now progressed to persistent atrial flutter.  Given his age, lack of symptoms, and controlled rate, conservative  management is perfectly reasonable. I will obtain echocardiogram. Unless EF is reduced, rhythm control is not warranted. Also, I am not certain that he will sustain sinus rhythm after cardioversion. Therefore, no plans for cardioversion at this time. In absence of symptms, he is not interested in AAD or ablation.  CHA2DS2VASC score at least 3, possible 5 (?prior stroke).  Continue Eliquis 5 mg twice daily Encourage diet and lifestyle modifications to facilitate weight loss. Emphasized importance of continuing CPAP.  We will reassess in 3 months  Thank you for referring the patient to [place]. Please feel free to contact with any questions.   Nigel Mormon, MD Pager: [phone] Office: [phone]

## 2022-07-06 DIAGNOSIS — H34832 Tributary (branch) retinal vein occlusion, left eye, with macular edema: Secondary | ICD-10-CM | POA: Diagnosis not present

## 2022-07-27 DIAGNOSIS — Z85828 Personal history of other malignant neoplasm of skin: Secondary | ICD-10-CM | POA: Diagnosis not present

## 2022-07-27 DIAGNOSIS — I8392 Asymptomatic varicose veins of left lower extremity: Secondary | ICD-10-CM | POA: Diagnosis not present

## 2022-07-27 DIAGNOSIS — I8391 Asymptomatic varicose veins of right lower extremity: Secondary | ICD-10-CM | POA: Diagnosis not present

## 2022-07-27 DIAGNOSIS — L718 Other rosacea: Secondary | ICD-10-CM | POA: Diagnosis not present

## 2022-07-27 DIAGNOSIS — D485 Neoplasm of uncertain behavior of skin: Secondary | ICD-10-CM | POA: Diagnosis not present

## 2022-07-27 DIAGNOSIS — L918 Other hypertrophic disorders of the skin: Secondary | ICD-10-CM | POA: Diagnosis not present

## 2022-07-27 DIAGNOSIS — L853 Xerosis cutis: Secondary | ICD-10-CM | POA: Diagnosis not present

## 2022-07-27 DIAGNOSIS — L820 Inflamed seborrheic keratosis: Secondary | ICD-10-CM | POA: Diagnosis not present

## 2022-07-27 DIAGNOSIS — L821 Other seborrheic keratosis: Secondary | ICD-10-CM | POA: Diagnosis not present

## 2022-07-30 ENCOUNTER — Ambulatory Visit: Payer: Medicare HMO

## 2022-07-30 DIAGNOSIS — I484 Atypical atrial flutter: Secondary | ICD-10-CM | POA: Diagnosis not present

## 2022-08-19 ENCOUNTER — Ambulatory Visit: Payer: Medicare HMO | Admitting: Cardiology

## 2022-08-19 ENCOUNTER — Encounter: Payer: Self-pay | Admitting: Cardiology

## 2022-08-19 VITALS — BP 118/76 | HR 86 | Temp 98.0°F | Resp 16 | Ht 66.0 in | Wt 257.0 lb

## 2022-08-19 DIAGNOSIS — E782 Mixed hyperlipidemia: Secondary | ICD-10-CM

## 2022-08-19 DIAGNOSIS — I484 Atypical atrial flutter: Secondary | ICD-10-CM | POA: Diagnosis not present

## 2022-08-19 MED ORDER — ATORVASTATIN CALCIUM 40 MG PO TABS: 40.0000 mg | ORAL_TABLET | Freq: Every evening | ORAL | 3 refills | Status: DC

## 2022-08-19 NOTE — Progress Notes (Signed)
Patient referred by Prince Solian, MD for atrial flutter  Subjective:   Tommy [name], male    DOB: [birth year], 81 y.o.   MRN: [id number]   Chief Complaint  Patient presents with   Atrial Flutter   Follow-up   Results    echo     HPI  81 y.o. Caucasian male with hypertension, hyperlipidemia, obesity, OSA on CPAP, persistent atrial flutter.   Patient is doing well, denies chest pain, shortness of breath, palpitations, leg edema, orthopnea, PND, TIA/syncope.  Initial consultation visit 06/2022: Patient is here with his wife. He is retired, stays active with work around the house but does not do any regular exercise. He was recently found to have atrial flutter on routine visit with his PCP Dr. Dagmar Hait. He denies chest pain, shortness of breath, palpitations, leg edema, orthopnea, PND, TIA/syncope.   He has OSA, uses CPAP regularly. 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   Current Outpatient Medications:    acetaminophen (TYLENOL) 500 MG tablet, Take 1,000 mg by mouth every 6 (six) hours as needed for moderate pain or headache., Disp: , Rfl:    amLODipine (NORVASC) 10 MG tablet, Take 10 mg by mouth every morning., Disp: , Rfl:    atorvastatin (LIPITOR) 20 MG tablet, Take 20 mg by mouth every evening., Disp: , Rfl:    brimonidine (ALPHAGAN) 0.2 % ophthalmic solution, Place 1 drop into the left eye 3 (three) times daily. , Disp: , Rfl: 11   buPROPion (WELLBUTRIN) 75 MG tablet, Take 75 mg by mouth daily., Disp: , Rfl:    Cholecalciferol (DIALYVITE VITAMIN D 5000) 125 MCG (5000 UT) capsule, Take 5,000 Units by mouth daily., Disp: , Rfl:    dorzolamide-timolol (COSOPT) 22.3-6.8 MG/ML ophthalmic solution, Place 1 drop into the left eye 2 (two) times daily. , Disp: , Rfl:    ELIQUIS 5 MG TABS tablet, Take 5 mg by mouth 2 (two) times daily., Disp: , Rfl:    fluticasone (FLONASE) 50  MCG/ACT nasal spray, Place 2 sprays into the nose daily as needed for allergies. , Disp: , Rfl:    latanoprost (XALATAN) 0.005 % ophthalmic solution, Place 1 drop into the left eye at bedtime., Disp: , Rfl:    methocarbamol (ROBAXIN) 500 MG tablet, Take 1 tablet (500 mg total) by mouth every 8 (eight) hours as needed for muscle spasms., Disp: 40 tablet, Rfl: 0   MOUNJARO 5 MG/0.5ML Pen, SMARTSIG:5 Milligram(s) SUB-Q Once a Week, Disp: , Rfl:    Multiple Vitamin (MULTIVITAMIN) tablet, Take 1 tablet daily by mouth., Disp: , Rfl:    naproxen sodium (ALEVE) 220 MG tablet, Take 220 mg by mouth daily as needed (pain). , Disp: , Rfl:    nystatin-triamcinolone (MYCOLOG II) cream, Apply 1 application topically daily as needed (rash). , Disp: , Rfl:    tamsulosin (FLOMAX) 0.4 MG CAPS capsule, Take 0.4 mg by mouth at bedtime. , Disp: , Rfl:    telmisartan (MICARDIS) 80 MG tablet, Take 80 mg by mouth at bedtime. , Disp: , Rfl:    venlafaxine XR (EFFEXOR-XR) 75 MG 24 hr capsule, Take 150 mg by mouth daily. , Disp: , Rfl:    vitamin E 400 UNIT capsule, Take 400 Units daily by mouth., Disp: , Rfl:    zinc gluconate 50 MG tablet, Take 50 mg by mouth daily., Disp: , Rfl:  Cardiovascular and other pertinent studies:  Reviewed external labs and tests, independently interpreted  Echocardiogram 07/30/2022:  Normal LV systolic function with visual EF 60-65%. Left ventricle cavity  is normal in size. Mild concentric hypertrophy of the left ventricle.  Normal global wall motion. Doppler evidence of grade I (impaired)  diastolic dysfunction, normal LAP. Calculated EF 61%.  Left atrial cavity is mildly dilated. A lipomatous septum is present.  Trileaflet aortic valve with no regurgitation. Sclerosis of the aortic  valve. Trace aortic valve stenosis.  Structurally normal tricuspid valve with trace regurgitation. No evidence  of pulmonary hypertension.   EKG 06/18/2022: Atrial flutter 92 bpm Incomplete right  bundle branch block Nonspecific ST-T changes   Recent labs: Jan-April 2023: Glucose NA, BUN/Cr 14/1.1. EGFR 58. HbA1C 5.5% Chol 169, TG 194, HDL 29, LDL 101 TSH 1.1 normal    Review of Systems  Cardiovascular:  Negative for chest pain, dyspnea on exertion, leg swelling, palpitations and syncope.         Vitals:   08/19/22 1029  BP: 118/76  Pulse: 86  Resp: 16  Temp: 98 F (36.7 C)  SpO2: 95%     Body mass index is 41.48 kg/m. Filed Weights   08/19/22 1029  Weight: 257 lb (116.6 kg)     Objective:   Physical Exam Vitals and nursing note reviewed.  Constitutional:      General: He is not in acute distress.    Appearance: He is obese.  Neck:     Vascular: No JVD.  Cardiovascular:     Rate and Rhythm: Normal rate and regular rhythm.     Heart sounds: Normal heart sounds. No murmur heard. Pulmonary:     Effort: Pulmonary effort is normal.     Breath sounds: Normal breath sounds. No wheezing or rales.  Musculoskeletal:     Right lower leg: No edema.     Left lower leg: No edema.         Visit diagnoses:   ICD-10-CM   1. Atypical atrial flutter (HCC)  I48.4     2. Mixed hyperlipidemia  E78.2 atorvastatin (LIPITOR) 40 MG tablet       Assessment & Recommendations:   81 y.o. Caucasian male with hypertension, hyperlipidemia, obesity, OSA on CPAP, referred for management of atrial flutter.  Atrial flutter: Persistent, rate controlled, likely long standing. In absence of symptoms or LV dysfunction, reasonable to continue rate control management alone. CHA2DS2VASC score at least 3, possible 5 (?prior stroke).  Continue Eliquis 5 mg twice daily Encourage diet and lifestyle modifications to facilitate weight loss. Emphasized importance of continuing CPAP.  Mixed hyperlipidemia: LDL 101. Increase lipitor to 40 mg daily.   F/u in 6 months  Thank you for referring the patient to [place]. Please feel free to contact with any questions.   Nigel Mormon, MD Pager: [phone] Office: [phone]

## 2022-08-23 DIAGNOSIS — H401123 Primary open-angle glaucoma, left eye, severe stage: Secondary | ICD-10-CM | POA: Diagnosis not present

## 2022-11-09 DIAGNOSIS — Z23 Encounter for immunization: Secondary | ICD-10-CM | POA: Diagnosis not present

## 2022-12-29 DIAGNOSIS — I10 Essential (primary) hypertension: Secondary | ICD-10-CM | POA: Diagnosis not present

## 2022-12-29 DIAGNOSIS — Z125 Encounter for screening for malignant neoplasm of prostate: Secondary | ICD-10-CM | POA: Diagnosis not present

## 2022-12-29 DIAGNOSIS — R7989 Other specified abnormal findings of blood chemistry: Secondary | ICD-10-CM | POA: Diagnosis not present

## 2022-12-29 DIAGNOSIS — R7301 Impaired fasting glucose: Secondary | ICD-10-CM | POA: Diagnosis not present

## 2022-12-29 DIAGNOSIS — E785 Hyperlipidemia, unspecified: Secondary | ICD-10-CM | POA: Diagnosis not present

## 2022-12-29 DIAGNOSIS — Z1212 Encounter for screening for malignant neoplasm of rectum: Secondary | ICD-10-CM | POA: Diagnosis not present

## 2023-01-05 DIAGNOSIS — E785 Hyperlipidemia, unspecified: Secondary | ICD-10-CM | POA: Diagnosis not present

## 2023-01-05 DIAGNOSIS — R7301 Impaired fasting glucose: Secondary | ICD-10-CM | POA: Diagnosis not present

## 2023-01-05 DIAGNOSIS — I4892 Unspecified atrial flutter: Secondary | ICD-10-CM | POA: Diagnosis not present

## 2023-01-05 DIAGNOSIS — Z0000 Encounter for general adult medical examination without abnormal findings: Secondary | ICD-10-CM | POA: Diagnosis not present

## 2023-01-05 DIAGNOSIS — I10 Essential (primary) hypertension: Secondary | ICD-10-CM | POA: Diagnosis not present

## 2023-01-05 DIAGNOSIS — R9431 Abnormal electrocardiogram [ECG] [EKG]: Secondary | ICD-10-CM | POA: Diagnosis not present

## 2023-01-05 DIAGNOSIS — R82998 Other abnormal findings in urine: Secondary | ICD-10-CM | POA: Diagnosis not present

## 2023-01-05 DIAGNOSIS — Z23 Encounter for immunization: Secondary | ICD-10-CM | POA: Diagnosis not present

## 2023-01-05 DIAGNOSIS — H34832 Tributary (branch) retinal vein occlusion, left eye, with macular edema: Secondary | ICD-10-CM | POA: Diagnosis not present

## 2023-01-05 DIAGNOSIS — M1611 Unilateral primary osteoarthritis, right hip: Secondary | ICD-10-CM | POA: Diagnosis not present

## 2023-01-19 DIAGNOSIS — M25552 Pain in left hip: Secondary | ICD-10-CM | POA: Diagnosis not present

## 2023-01-19 DIAGNOSIS — M5416 Radiculopathy, lumbar region: Secondary | ICD-10-CM | POA: Diagnosis not present

## 2023-01-19 DIAGNOSIS — Z96652 Presence of left artificial knee joint: Secondary | ICD-10-CM | POA: Diagnosis not present

## 2023-01-19 DIAGNOSIS — M1612 Unilateral primary osteoarthritis, left hip: Secondary | ICD-10-CM | POA: Diagnosis not present

## 2023-01-24 DIAGNOSIS — M25552 Pain in left hip: Secondary | ICD-10-CM | POA: Diagnosis not present

## 2023-02-07 DIAGNOSIS — M1612 Unilateral primary osteoarthritis, left hip: Secondary | ICD-10-CM | POA: Diagnosis not present

## 2023-02-16 NOTE — Progress Notes (Signed)
Patient referred by Prince Solian, MD for atrial flutter  Subjective:   Tommy [name], male    DOB: 29-Nov-1941, 82 y.o.   MRN: [id number]   No chief complaint on file.    HPI  82 y.o. Caucasian male with hypertension, hyperlipidemia, obesity, OSA on CPAP, persistent atrial flutter.   Patient is doing well, denies chest pain, shortness of breath, palpitations, leg edema, orthopnea, PND, TIA/syncope.  Initial consultation visit 06/2022: Patient is here with his wife. He is retired, stays active with work around the house but does not do any regular exercise. He was recently found to have atrial flutter on routine visit with his PCP Dr. Dagmar Hait. He denies chest pain, shortness of breath, palpitations, leg edema, orthopnea, PND, TIA/syncope.   He has OSA, uses CPAP regularly. 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   Current Outpatient Medications:    acetaminophen (TYLENOL) 500 MG tablet, Take 1,000 mg by mouth every 6 (six) hours as needed for moderate pain or headache., Disp: , Rfl:    amLODipine (NORVASC) 10 MG tablet, Take 10 mg by mouth every morning., Disp: , Rfl:    atorvastatin (LIPITOR) 40 MG tablet, Take 1 tablet (40 mg total) by mouth every evening., Disp: 90 tablet, Rfl: 3   brimonidine (ALPHAGAN) 0.2 % ophthalmic solution, Place 1 drop into the left eye 3 (three) times daily. , Disp: , Rfl: 11   buPROPion (WELLBUTRIN) 75 MG tablet, Take 75 mg by mouth daily., Disp: , Rfl:    Cholecalciferol (DIALYVITE VITAMIN D 5000) 125 MCG (5000 UT) capsule, Take 5,000 Units by mouth daily., Disp: , Rfl:    dorzolamide-timolol (COSOPT) 22.3-6.8 MG/ML ophthalmic solution, Place 1 drop into the left eye 2 (two) times daily. , Disp: , Rfl:    ELIQUIS 5 MG TABS tablet, Take 5 mg by mouth 2 (two) times daily., Disp: , Rfl:    fluticasone (FLONASE) 50 MCG/ACT nasal spray, Place 2 sprays into  the nose daily as needed for allergies. , Disp: , Rfl:    latanoprost (XALATAN) 0.005 % ophthalmic solution, Place 1 drop into the left eye at bedtime., Disp: , Rfl:    methocarbamol (ROBAXIN) 500 MG tablet, Take 1 tablet (500 mg total) by mouth every 8 (eight) hours as needed for muscle spasms., Disp: 40 tablet, Rfl: 0   metoprolol succinate (TOPROL-XL) 25 MG 24 hr tablet, Take 25 mg by mouth daily., Disp: , Rfl:    MOUNJARO 5 MG/0.5ML Pen, SMARTSIG:5 Milligram(s) SUB-Q Once a Week (Patient not taking: Reported on 08/19/2022), Disp: , Rfl:    Multiple Vitamin (MULTIVITAMIN) tablet, Take 1 tablet daily by mouth., Disp: , Rfl:    naproxen sodium (ALEVE) 220 MG tablet, Take 220 mg by mouth daily as needed (pain). , Disp: , Rfl:    nystatin-triamcinolone (MYCOLOG II) cream, Apply 1 application topically daily as needed (rash). , Disp: , Rfl:    tamsulosin (FLOMAX) 0.4 MG CAPS capsule, Take 0.4 mg by mouth at bedtime. , Disp: , Rfl:    telmisartan (MICARDIS) 80 MG tablet, Take 80 mg by mouth at bedtime. , Disp: , Rfl:    venlafaxine XR (EFFEXOR-XR) 75 MG 24 hr capsule, Take 150 mg by mouth daily. , Disp: , Rfl:    vitamin E 400 UNIT capsule, Take 400 Units daily by mouth., Disp: , Rfl:    zinc  gluconate 50 MG tablet, Take 50 mg by mouth daily., Disp: , Rfl:    Cardiovascular and other pertinent studies:  Reviewed external labs and tests, independently interpreted  Echocardiogram 07/30/2022:  Normal LV systolic function with visual EF 60-65%. Left ventricle cavity  is normal in size. Mild concentric hypertrophy of the left ventricle.  Normal global wall motion. Doppler evidence of grade I (impaired)  diastolic dysfunction, normal LAP. Calculated EF 61%.  Left atrial cavity is mildly dilated. A lipomatous septum is present.  Trileaflet aortic valve with no regurgitation. Sclerosis of the aortic  valve. Trace aortic valve stenosis.  Structurally normal tricuspid valve with trace regurgitation. No  evidence  of pulmonary hypertension.   EKG 06/18/2022: Atrial flutter 92 bpm Incomplete right bundle branch block Nonspecific ST-T changes   Recent labs: Jan-April 2023: Glucose NA, BUN/Cr 14/1.1. EGFR 58. HbA1C 5.5% Chol 169, TG 194, HDL 29, LDL 101 TSH 1.1 normal    Review of Systems  Cardiovascular:  Negative for chest pain, dyspnea on exertion, leg swelling, palpitations and syncope.         There were no vitals filed for this visit.    There is no height or weight on file to calculate BMI. There were no vitals filed for this visit.    Objective:   Physical Exam Vitals and nursing note reviewed.  Constitutional:      General: He is not in acute distress.    Appearance: He is obese.  Neck:     Vascular: No JVD.  Cardiovascular:     Rate and Rhythm: Normal rate and regular rhythm.     Heart sounds: Normal heart sounds. No murmur heard. Pulmonary:     Effort: Pulmonary effort is normal.     Breath sounds: Normal breath sounds. No wheezing or rales.  Musculoskeletal:     Right lower leg: No edema.     Left lower leg: No edema.         Visit diagnoses: No diagnosis found.    Assessment & Recommendations:   82 y.o. Caucasian male with hypertension, hyperlipidemia, obesity, OSA on CPAP, referred for management of atrial flutter.  *** Atrial flutter: Persistent, rate controlled, likely long standing. In absence of symptoms or LV dysfunction, reasonable to continue rate control management alone. CHA2DS2VASC score at least 3, possible 5 (?prior stroke).  Continue Eliquis 5 mg twice daily Encourage diet and lifestyle modifications to facilitate weight loss. Emphasized importance of continuing CPAP.  *** Mixed hyperlipidemia: LDL 101. Increase lipitor to 40 mg daily.   F/u in 6 months  Thank you for referring the patient to [place]. Please feel free to contact with any questions.   Nigel Mormon, MD Pager: [phone] Office: [phone]

## 2023-02-17 ENCOUNTER — Encounter: Payer: Self-pay | Admitting: Cardiology

## 2023-02-17 ENCOUNTER — Ambulatory Visit: Payer: Medicare HMO | Admitting: Cardiology

## 2023-02-17 VITALS — BP 123/68 | HR 83 | Resp 16 | Ht 66.0 in | Wt 261.0 lb

## 2023-02-17 DIAGNOSIS — Z01810 Encounter for preprocedural cardiovascular examination: Secondary | ICD-10-CM

## 2023-02-17 DIAGNOSIS — E782 Mixed hyperlipidemia: Secondary | ICD-10-CM

## 2023-02-17 DIAGNOSIS — I484 Atypical atrial flutter: Secondary | ICD-10-CM

## 2023-02-17 DIAGNOSIS — I10 Essential (primary) hypertension: Secondary | ICD-10-CM

## 2023-02-21 DIAGNOSIS — I484 Atypical atrial flutter: Secondary | ICD-10-CM | POA: Diagnosis not present

## 2023-02-21 DIAGNOSIS — E785 Hyperlipidemia, unspecified: Secondary | ICD-10-CM | POA: Diagnosis not present

## 2023-02-28 ENCOUNTER — Ambulatory Visit (HOSPITAL_COMMUNITY)
Admission: RE | Admit: 2023-02-28 | Discharge: 2023-02-28 | Disposition: A | Discharge status: Home / Self Care | Payer: Medicare HMO | Attending: Cardiology | Admitting: Cardiology

## 2023-02-28 ENCOUNTER — Ambulatory Visit (HOSPITAL_BASED_OUTPATIENT_CLINIC_OR_DEPARTMENT_OTHER): Payer: Medicare HMO | Admitting: Registered Nurse

## 2023-02-28 ENCOUNTER — Encounter (HOSPITAL_COMMUNITY): Payer: Self-pay | Admitting: Cardiology

## 2023-02-28 ENCOUNTER — Ambulatory Visit (HOSPITAL_COMMUNITY): Payer: Medicare HMO | Admitting: Registered Nurse

## 2023-02-28 ENCOUNTER — Other Ambulatory Visit: Payer: Self-pay

## 2023-02-28 ENCOUNTER — Encounter (HOSPITAL_COMMUNITY)
Admission: RE | Disposition: A | Discharge status: Home / Self Care | Payer: Self-pay | Source: Home / Self Care | Attending: Cardiology

## 2023-02-28 DIAGNOSIS — Z6841 Body Mass Index (BMI) 40.0 and over, adult: Secondary | ICD-10-CM

## 2023-02-28 DIAGNOSIS — E782 Mixed hyperlipidemia: Secondary | ICD-10-CM | POA: Insufficient documentation

## 2023-02-28 DIAGNOSIS — E669 Obesity, unspecified: Secondary | ICD-10-CM | POA: Diagnosis not present

## 2023-02-28 DIAGNOSIS — I4892 Unspecified atrial flutter: Secondary | ICD-10-CM | POA: Diagnosis not present

## 2023-02-28 DIAGNOSIS — I484 Atypical atrial flutter: Secondary | ICD-10-CM | POA: Diagnosis not present

## 2023-02-28 DIAGNOSIS — G4733 Obstructive sleep apnea (adult) (pediatric): Secondary | ICD-10-CM | POA: Insufficient documentation

## 2023-02-28 DIAGNOSIS — Z01810 Encounter for preprocedural cardiovascular examination: Secondary | ICD-10-CM

## 2023-02-28 DIAGNOSIS — Z87891 Personal history of nicotine dependence: Secondary | ICD-10-CM | POA: Diagnosis not present

## 2023-02-28 DIAGNOSIS — Z7901 Long term (current) use of anticoagulants: Secondary | ICD-10-CM | POA: Insufficient documentation

## 2023-02-28 DIAGNOSIS — I10 Essential (primary) hypertension: Secondary | ICD-10-CM | POA: Diagnosis not present

## 2023-02-28 HISTORY — PX: CARDIOVERSION: SHX1299

## 2023-02-28 LAB — POCT I-STAT, CHEM 8
BUN: 19 mg/dL (ref 8–23)
Calcium, Ion: 1.19 mmol/L (ref 1.15–1.40)
Chloride: 99 mmol/L (ref 98–111)
Creatinine, Ser: 1.1 mg/dL (ref 0.61–1.24)
Glucose, Bld: 128 mg/dL — ABNORMAL HIGH (ref 70–99)
HCT: 46 % (ref 39.0–52.0)
Hemoglobin: 15.6 g/dL (ref 13.0–17.0)
Potassium: 3.2 mmol/L — ABNORMAL LOW (ref 3.5–5.1)
Sodium: 141 mmol/L (ref 135–145)
TCO2: 29 mmol/L (ref 22–32)

## 2023-02-28 SURGERY — CARDIOVERSION
Anesthesia: General

## 2023-02-28 MED ORDER — LIDOCAINE 2% (20 MG/ML) 5 ML SYRINGE
INTRAMUSCULAR | Status: DC | PRN
  Administered 2023-02-28: 60 mg via INTRAVENOUS

## 2023-02-28 MED ORDER — PROPOFOL 10 MG/ML IV BOLUS
INTRAVENOUS | Status: DC | PRN
  Administered 2023-02-28: 70 mg via INTRAVENOUS

## 2023-02-28 MED ORDER — SODIUM CHLORIDE 0.9 % IV SOLN: INTRAVENOUS | Status: DC

## 2023-02-28 NOTE — Procedures (Signed)
Direct current cardioversion 02/28/2023 9:17 AM  Indication symptomatic A. Flutter.  Procedure: Using IV Propofol and IV Lidocaine (for reducing venous pain) for achieving deep sedation, synchronized direct current cardioversion performed. Patient was delivered with 120 Joules of electricity X 1 with success to NSR. Patient tolerated the procedure well. No immediate complication noted.   Allergies as of 02/28/2023       Reactions   Ozempic (0.25 Or 0.5 Mg-dose) [semaglutide(0.25 Or 0.5mg -dos)] Nausea And Vomiting   Dizziness, stumble and fall         Medication List     TAKE these medications    acetaminophen 500 MG tablet Commonly known as: TYLENOL Take 1,000 mg by mouth every 6 (six) hours as needed for moderate pain or headache.   amLODipine 10 MG tablet Commonly known as: NORVASC Take 10 mg by mouth every morning.   atorvastatin 40 MG tablet Commonly known as: LIPITOR Take 1 tablet (40 mg total) by mouth every evening.   brimonidine 0.2 % ophthalmic solution Commonly known as: ALPHAGAN Place 1 drop into the left eye 3 (three) times daily.   buPROPion 75 MG tablet Commonly known as: WELLBUTRIN Take 75 mg by mouth daily.   Dialyvite Vitamin D 5000 125 MCG (5000 UT) capsule Generic drug: Cholecalciferol Take 5,000 Units by mouth daily.   dorzolamide-timolol 2-0.5 % ophthalmic solution Commonly known as: COSOPT Place 1 drop into the left eye 2 (two) times daily.   Eliquis 5 MG Tabs tablet Generic drug: apixaban Take 5 mg by mouth 2 (two) times daily.   fluticasone 50 MCG/ACT nasal spray Commonly known as: FLONASE Place 2 sprays into the nose daily as needed for allergies.   latanoprost 0.005 % ophthalmic solution Commonly known as: XALATAN Place 1 drop into the left eye at bedtime.   methocarbamol 500 MG tablet Commonly known as: Robaxin Take 1 tablet (500 mg total) by mouth every 8 (eight) hours as needed for muscle spasms.   metoprolol succinate 25 MG  24 hr tablet Commonly known as: TOPROL-XL Take 25 mg by mouth daily.   multivitamin tablet Take 1 tablet daily by mouth.   naproxen sodium 220 MG tablet Commonly known as: ALEVE Take 220 mg by mouth daily as needed (pain).   nystatin-triamcinolone cream Commonly known as: MYCOLOG II Apply 1 application topically daily as needed (rash).   tamsulosin 0.4 MG Caps capsule Commonly known as: FLOMAX Take 0.4 mg by mouth at bedtime.   telmisartan 80 MG tablet Commonly known as: MICARDIS Take 80 mg by mouth at bedtime.   venlafaxine XR 75 MG 24 hr capsule Commonly known as: EFFEXOR-XR Take 150 mg by mouth daily.   vitamin E 180 MG (400 UNITS) capsule Take 400 Units daily by mouth.   zinc gluconate 50 MG tablet Take 50 mg by mouth daily.          Floydene Flock, DO, [facility] 02/28/2023, 9:17 AM Office: [phone] Fax: [phone] Pager: [phone]

## 2023-02-28 NOTE — Discharge Instructions (Signed)

## 2023-02-28 NOTE — Anesthesia Postprocedure Evaluation (Signed)
Anesthesia Post Note  Patient: Tommy [name]  Procedure(s) Performed: CARDIOVERSION     Patient location during evaluation: Endoscopy Anesthesia Type: General Level of consciousness: awake and alert Pain management: pain level controlled Vital Signs Assessment: post-procedure vital signs reviewed and stable Respiratory status: spontaneous breathing, nonlabored ventilation, respiratory function stable and patient connected to nasal cannula oxygen Cardiovascular status: blood pressure returned to baseline and stable Postop Assessment: no apparent nausea or vomiting Anesthetic complications: no  There were no known notable events for this encounter.  Last Vitals:  Vitals:   02/28/23 0935 02/28/23 0943  BP: 111/78 108/82  Pulse: 89 89  Resp: 19 (!) 22  Temp:    SpO2: 93% 96%    Last Pain:  Vitals:   02/28/23 0943  TempSrc:   PainSc: 0-No pain                 Barnet Glasgow

## 2023-02-28 NOTE — H&P (Signed)
Patient referred by Prince Solian, MD for atrial flutter   Subjective:    Tommy [name], male    DOB: 01/07/1941, 82 y.o.   MRN: [id number]         Chief Complaint  Patient presents with   Atrial Flutter   Follow-up      6 month        HPI   82 y.o. Caucasian male with hypertension, hyperlipidemia, obesity, OSA on CPAP, persistent atrial flutter.     Patient is doing well, denies chest pain, shortness of breath, palpitations, leg edema, orthopnea, PND, TIA/syncope. He has upcoming hip surgery by Dr. Ennis Forts in [place] on 04/06/2023.    Initial consultation visit 06/2022: Patient is here with his wife. He is retired, stays active with work around the house but does not do any regular exercise. He was recently found to have atrial flutter on routine visit with his PCP Dr. Dagmar Hait. He denies chest pain, shortness of breath, palpitations, leg edema, orthopnea, PND, TIA/syncope.    He has OSA, uses CPAP regularly. 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    Current Outpatient Medications:    acetaminophen (TYLENOL) 500 MG tablet, Take 1,000 mg by mouth every 6 (six) hours as needed for moderate pain or headache., Disp: , Rfl:    amLODipine (NORVASC) 10 MG tablet, Take 10 mg by mouth every morning., Disp: , Rfl:    atorvastatin (LIPITOR) 40 MG tablet, Take 1 tablet (40 mg total) by mouth every evening., Disp: 90 tablet, Rfl: 3   brimonidine (ALPHAGAN) 0.2 % ophthalmic solution, Place 1 drop into the left eye 3 (three) times daily. , Disp: , Rfl: 11   buPROPion (WELLBUTRIN) 75 MG tablet, Take 75 mg by mouth daily., Disp: , Rfl:    Cholecalciferol (DIALYVITE VITAMIN D 5000) 125 MCG (5000 UT) capsule, Take 5,000 Units by mouth daily., Disp: , Rfl:    dorzolamide-timolol (COSOPT) 22.3-6.8 MG/ML ophthalmic solution, Place 1 drop into the left eye 2 (two) times daily. , Disp: , Rfl:     ELIQUIS 5 MG TABS tablet, Take 5 mg by mouth 2 (two) times daily., Disp: , Rfl:    fluticasone (FLONASE) 50 MCG/ACT nasal spray, Place 2 sprays into the nose daily as needed for allergies. , Disp: , Rfl:    latanoprost (XALATAN) 0.005 % ophthalmic solution, Place 1 drop into the left eye at bedtime., Disp: , Rfl:    methocarbamol (ROBAXIN) 500 MG tablet, Take 1 tablet (500 mg total) by mouth every 8 (eight) hours as needed for muscle spasms., Disp: 40 tablet, Rfl: 0   metoprolol succinate (TOPROL-XL) 25 MG 24 hr tablet, Take 25 mg by mouth daily., Disp: , Rfl:    nystatin-triamcinolone (MYCOLOG II) cream, Apply 1 application topically daily as needed (rash). , Disp: , Rfl:    tamsulosin (FLOMAX) 0.4 MG CAPS capsule, Take 0.4 mg by mouth at bedtime. , Disp: , Rfl:    telmisartan (MICARDIS) 80 MG tablet, Take 80 mg by mouth at bedtime. , Disp: , Rfl:    venlafaxine XR (EFFEXOR-XR) 75 MG 24 hr capsule, Take 150 mg by mouth daily. , Disp: , Rfl:    vitamin E 400 UNIT capsule, Take 400 Units daily by mouth., Disp: , Rfl:    zinc gluconate 50 MG tablet, Take 50 mg by mouth daily., Disp: ,  Rfl:    Multiple Vitamin (MULTIVITAMIN) tablet, Take 1 tablet daily by mouth., Disp: , Rfl:    naproxen sodium (ALEVE) 220 MG tablet, Take 220 mg by mouth daily as needed (pain).  (Patient not taking: Reported on 02/17/2023), Disp: , Rfl:      Cardiovascular and other pertinent studies:   Reviewed external labs and tests, independently interpreted   EKG 02/17/2023: Atrial flutter 98 bpm ST depression -Nondiagnostic No change compared to previous EKG   Echocardiogram 07/30/2022:  Normal LV systolic function with visual EF 60-65%. Left ventricle cavity  is normal in size. Mild concentric hypertrophy of the left ventricle.  Normal global wall motion. Doppler evidence of grade I (impaired)  diastolic dysfunction, normal LAP. Calculated EF 61%.  Left atrial cavity is mildly dilated. A lipomatous septum is present.   Trileaflet aortic valve with no regurgitation. Sclerosis of the aortic  valve. Trace aortic valve stenosis.  Structurally normal tricuspid valve with trace regurgitation. No evidence  of pulmonary hypertension.    Recent labs: Jan-April 2023: Glucose NA, BUN/Cr 14/1.1. EGFR 58. HbA1C 5.5% Chol 169, TG 194, HDL 29, LDL 101 TSH 1.1 normal       Review of Systems  Cardiovascular:  Negative for chest pain, dyspnea on exertion, leg swelling, palpitations and syncope.  Musculoskeletal:  Positive for joint pain.                Vitals:    02/17/23 0922  BP: 123/68  Pulse: 83  Resp: 16  SpO2: 97%        Body mass index is 42.13 kg/m.    Filed Weights    02/17/23 0922  Weight: 261 lb (118.4 kg)        Objective:    Objective  Physical Exam Vitals and nursing note reviewed.  Constitutional:      General: He is not in acute distress.    Appearance: He is obese.  Neck:     Vascular: No JVD.  Cardiovascular:     Rate and Rhythm: Normal rate and regular rhythm.     Heart sounds: Normal heart sounds. No murmur heard. Pulmonary:     Effort: Pulmonary effort is normal.     Breath sounds: Normal breath sounds. No wheezing or rales.  Musculoskeletal:     Right lower leg: No edema.     Left lower leg: No edema.              Visit diagnoses:     ICD-10-CM    1. Atypical atrial flutter (HCC)  I48.4 EKG 12-Lead      Basic metabolic panel     2. Mixed hyperlipidemia  E78.2 Lipid panel     3. Essential hypertension  I10           Assessment & Recommendations:    82 y.o. Caucasian male with hypertension, hyperlipidemia, obesity, OSA on CPAP, referred for management of atrial flutter.   Atrial flutter: Persistent, rate controlled, likely long standing. Continue metoprolol succinite 25 mg daily.  Normal LV function. It is reasonable to give a least one attempt at cardioversion. If done within next 7-10 days, it will still give [place] 4 to 6 weeks prior to having  to hold anticoagulation for hip surgery on 04/06/2023.  If it could not be performed within neck 7-10 days, I will forego cardioversion for now to avoid Eliquis interruption too soon after cardioversion prior to hip surgery. CHA2DS2VASC score at least 3, possible 5 (?prior stroke).  Continue Eliquis 5 mg twice daily After cardioversion, he may proceed with hip surgery with low cardiac risk.   Mixed hyperlipidemia: Continue lipitor 40 mg daily.  Check lipid panel.   Will proceed with DCCV this morning Amire Leazer, DO 02/28/23 9:06 AM

## 2023-02-28 NOTE — Anesthesia Preprocedure Evaluation (Addendum)
Anesthesia Evaluation  Patient identified by MRN, date of birth, ID band Patient awake    Reviewed: Allergy & Precautions, NPO status , Patient's Chart, lab work & pertinent test results  Airway Mallampati: III  TM Distance: >3 FB Neck ROM: Full    Dental no notable dental hx. (+) Poor Dentition, Missing, Chipped   Pulmonary former smoker   Pulmonary exam normal breath sounds clear to auscultation       Cardiovascular hypertension, Normal cardiovascular exam+ dysrhythmias Atrial Fibrillation  Rhythm:Regular Rate:Normal     Neuro/Psych    GI/Hepatic   Endo/Other    Renal/GU      Musculoskeletal   Abdominal  (+) + obese (BMI 40)  Peds  Hematology   Anesthesia Other Findings   Reproductive/Obstetrics                             Anesthesia Physical Anesthesia Plan  ASA: 3  Anesthesia Plan: General   Post-op Pain Management:    Induction: Intravenous  PONV Risk Score and Plan: Treatment may vary due to age or medical condition  Airway Management Planned: Mask  Additional Equipment: None  Intra-op Plan:   Post-operative Plan:   Informed Consent: I have reviewed the patients History and Physical, chart, labs and discussed the procedure including the risks, benefits and alternatives for the proposed anesthesia with the patient or authorized representative who has indicated his/her understanding and acceptance.     Dental advisory given  Plan Discussed with:   Anesthesia Plan Comments: (Last semaglutide)       Anesthesia Quick Evaluation

## 2023-02-28 NOTE — Transfer of Care (Signed)
Immediate Anesthesia Transfer of Care Note  Patient: Tommy [name]  Procedure(s) Performed: CARDIOVERSION  Patient Location: PACU and Endoscopy Unit  Anesthesia Type:MAC  Level of Consciousness: drowsy and patient cooperative  Airway & Oxygen Therapy: Patient Spontanous Breathing  Post-op Assessment: Report given to RN and Post -op Vital signs reviewed and stable  Post vital signs: Reviewed and stable  Last Vitals:  Vitals Value Taken Time  BP    Temp    Pulse    Resp    SpO2      Last Pain:  Vitals:   02/28/23 0835  TempSrc: Tympanic  PainSc: 0-No pain         Complications: There were no known notable events for this encounter.

## 2023-03-01 ENCOUNTER — Telehealth: Payer: Self-pay

## 2023-03-01 ENCOUNTER — Encounter (HOSPITAL_COMMUNITY): Payer: Self-pay | Admitting: Internal Medicine

## 2023-03-01 NOTE — Telephone Encounter (Signed)
Patient wants to know if he can stop taking Eliquis since his heart is back in rhythm.

## 2023-03-01 NOTE — Telephone Encounter (Signed)
You do not need to call the patient.  I called the patient and emphasized the importance of continued anticoagulation. He will hold it two days before his hip surgery on 4/23 and resume it thereafter. Technically, 4 weeks anticoagulation post cardioversion for atrial flutter should suffice, but he has high risk of recurrence given his risk factors. Therefore, I recommend resuming it day after hip[ surgery.  Nigel Mormon, MD

## 2023-03-16 ENCOUNTER — Ambulatory Visit: Payer: Medicare HMO | Admitting: Adult Health

## 2023-03-17 DIAGNOSIS — M1612 Unilateral primary osteoarthritis, left hip: Secondary | ICD-10-CM | POA: Diagnosis not present

## 2023-03-22 DIAGNOSIS — Z961 Presence of intraocular lens: Secondary | ICD-10-CM | POA: Diagnosis not present

## 2023-03-22 DIAGNOSIS — H401123 Primary open-angle glaucoma, left eye, severe stage: Secondary | ICD-10-CM | POA: Diagnosis not present

## 2023-03-22 DIAGNOSIS — H401111 Primary open-angle glaucoma, right eye, mild stage: Secondary | ICD-10-CM | POA: Diagnosis not present

## 2023-03-22 DIAGNOSIS — H34832 Tributary (branch) retinal vein occlusion, left eye, with macular edema: Secondary | ICD-10-CM | POA: Diagnosis not present

## 2023-03-23 DIAGNOSIS — E785 Hyperlipidemia, unspecified: Secondary | ICD-10-CM | POA: Diagnosis not present

## 2023-03-23 DIAGNOSIS — H34832 Tributary (branch) retinal vein occlusion, left eye, with macular edema: Secondary | ICD-10-CM | POA: Diagnosis not present

## 2023-03-23 DIAGNOSIS — I484 Atypical atrial flutter: Secondary | ICD-10-CM | POA: Diagnosis not present

## 2023-03-23 DIAGNOSIS — Z01812 Encounter for preprocedural laboratory examination: Secondary | ICD-10-CM | POA: Diagnosis not present

## 2023-03-23 DIAGNOSIS — I959 Hypotension, unspecified: Secondary | ICD-10-CM | POA: Diagnosis not present

## 2023-03-23 DIAGNOSIS — M1712 Unilateral primary osteoarthritis, left knee: Secondary | ICD-10-CM | POA: Diagnosis not present

## 2023-03-23 DIAGNOSIS — M1612 Unilateral primary osteoarthritis, left hip: Secondary | ICD-10-CM | POA: Diagnosis not present

## 2023-03-23 DIAGNOSIS — I10 Essential (primary) hypertension: Secondary | ICD-10-CM | POA: Diagnosis not present

## 2023-03-23 DIAGNOSIS — N400 Enlarged prostate without lower urinary tract symptoms: Secondary | ICD-10-CM | POA: Diagnosis not present

## 2023-03-23 DIAGNOSIS — Z01818 Encounter for other preprocedural examination: Secondary | ICD-10-CM | POA: Diagnosis not present

## 2023-03-23 DIAGNOSIS — F39 Unspecified mood [affective] disorder: Secondary | ICD-10-CM | POA: Diagnosis not present

## 2023-03-23 DIAGNOSIS — G4733 Obstructive sleep apnea (adult) (pediatric): Secondary | ICD-10-CM | POA: Diagnosis not present

## 2023-03-23 DIAGNOSIS — Z6841 Body Mass Index (BMI) 40.0 and over, adult: Secondary | ICD-10-CM | POA: Diagnosis not present

## 2023-03-23 DIAGNOSIS — Z01810 Encounter for preprocedural cardiovascular examination: Secondary | ICD-10-CM | POA: Diagnosis not present

## 2023-03-23 NOTE — Progress Notes (Unsigned)
PATIENT: Tommy [name] DOB: May 07, 1941  REASON FOR VISIT: follow up HISTORY FROM: patient PRIMARY NEUROLOGIST: Dr. Vickey Huger  Chief Complaint  Patient presents with   Follow-up    Rm 4, wife.   Doing ok.  No concerns.  Had flu 3 wks ago,hard to wear but you perserved.     HISTORY OF PRESENT ILLNESS: Today 03/24/23:  Tommy [name] is a 82 y.o. male with a history of OSA on CPAP. Returns today for follow-up.  Patient reports that he had the flu 3 weeks ago and was unable to use the CPAP consistently.  He just recently changed out his mask.  He does feel it leaking at night but has gotten better since he changed his mask.  His DL is below    4/54/09: Tommy [name] is a 82 year old male with a history of OSA on CPAP.  Returns today for follow-up.  He states that he tries to use his machine nightly.  He was in [place] recently and the power went out so he was not bale to use >4 hours. Does feel the mask leaking if he doesn't sleep supine. Currently has a full facemask.    REVIEW OF SYSTEMS: Out of a complete 14 system review of symptoms, the patient complains only of the following symptoms, and all other reviewed systems are negative.  ESS 7  ALLERGIES: Allergies  Allergen Reactions   Ozempic (0.25 Or 0.5 Mg-Dose) [Semaglutide(0.25 Or 0.5mg -Dos)] Nausea And Vomiting    Dizziness, stumble and fall     HOME MEDICATIONS: Outpatient Medications Prior to Visit  Medication Sig Dispense Refill   acetaminophen (TYLENOL) 500 MG tablet Take 1,000 mg by mouth every 6 (six) hours as needed for moderate pain or headache.     amLODipine (NORVASC) 10 MG tablet Take 10 mg by mouth every morning.     atorvastatin (LIPITOR) 40 MG tablet Take 1 tablet (40 mg total) by mouth every evening. 90 tablet 3   brimonidine (ALPHAGAN) 0.2 % ophthalmic solution Place 1 drop into the left eye 3 (three) times daily.   11   buPROPion (WELLBUTRIN) 75 MG tablet Take 75 mg by mouth daily.      Cholecalciferol (DIALYVITE VITAMIN D 5000) 125 MCG (5000 UT) capsule Take 5,000 Units by mouth daily.     dorzolamide-timolol (COSOPT) 22.3-6.8 MG/ML ophthalmic solution Place 1 drop into the left eye 2 (two) times daily.      ELIQUIS 5 MG TABS tablet Take 5 mg by mouth 2 (two) times daily.     fluticasone (FLONASE) 50 MCG/ACT nasal spray Place 2 sprays into the nose daily as needed for allergies.      methocarbamol (ROBAXIN) 500 MG tablet Take 1 tablet (500 mg total) by mouth every 8 (eight) hours as needed for muscle spasms. 40 tablet 0   metoprolol succinate (TOPROL-XL) 25 MG 24 hr tablet Take 25 mg by mouth daily.     Multiple Vitamin (MULTIVITAMIN) tablet Take 1 tablet daily by mouth.     naproxen sodium (ALEVE) 220 MG tablet Take 220 mg by mouth daily as needed (pain).     nystatin-triamcinolone (MYCOLOG II) cream Apply 1 application topically daily as needed (rash).      tamsulosin (FLOMAX) 0.4 MG CAPS capsule Take 0.4 mg by mouth at bedtime.      telmisartan (MICARDIS) 80 MG tablet Take 80 mg by mouth at bedtime.      venlafaxine XR (EFFEXOR-XR) 75 MG 24 hr capsule Take 150  mg by mouth daily.      vitamin E 400 UNIT capsule Take 400 Units daily by mouth.     zinc gluconate 50 MG tablet Take 50 mg by mouth daily.     latanoprost (XALATAN) 0.005 % ophthalmic solution Place 1 drop into the left eye at bedtime.     No facility-administered medications prior to visit.    PAST MEDICAL HISTORY: Past Medical History:  Diagnosis Date   Arthritis    Eye problem    treating for "blood clot behind the retina" in L eye   History of kidney stones    Hyperlipemia    Hypertension    Hypoxemia 09/02/2014   OSA on CPAP    PLMD (periodic limb movement disorder) 09/02/2014   Seasonal allergies    Sleep apnea    uses a cpap    PAST SURGICAL HISTORY: Past Surgical History:  Procedure Laterality Date   BUNIONECTOMY Right 01/03/2014   Procedure: RIGHT LAPIDUS BUNION CORRECTION;  Surgeon: Toni Arthurs, MD;  Location: [facility];  Service: Orthopedics;  Laterality: Right;   CARDIOVERSION N/A 02/28/2023   Procedure: CARDIOVERSION;  Surgeon: Clotilde Dieter, DO;  Location: [facility];  Service: Cardiovascular;  Laterality: N/A;   JOINT REPLACEMENT     bilateral knee replacemts   JOINT REPLACEMENT     bilateral shoulder replacemts   KNEE ARTHROSCOPY     right and left   SHOULDER ARTHROSCOPY  12/13/2009   right   TOTAL HIP ARTHROPLASTY Right    TOTAL KNEE ARTHROPLASTY  12/14/2003   right   TOTAL KNEE ARTHROPLASTY  12/13/2002   left   TOTAL SHOULDER ARTHROPLASTY  12/13/2010   left   TRICEPS TENDON REPAIR Left 05/02/2020   Procedure: Left triceps tendon repair reconstruction as necessary with bursectomy and ulnar nerve release at elbow;  Surgeon: Dominica Severin, MD;  Location: [facility];  Service: Orthopedics;  Laterality: Left;  2 hrs    FAMILY HISTORY: Family History  Problem Relation Age of Onset   Alzheimer's disease Mother    Dementia Mother    Glaucoma Father    Heart Problems Maternal Grandmother    Heart Problems Maternal Grandfather    Sleep apnea Neg Hx     SOCIAL HISTORY: Social History   Socioeconomic History   Marital status: Married    Spouse name: Jasmine December   Number of children: 4   Years of education: 13   Highest education level: Not on file  Occupational History   Not on file  Tobacco Use   Smoking status: Former    Packs/day: 0.50    Years: 8.00    Additional pack years: 0.00    Total pack years: 4.00    Types: Cigarettes    Quit date: 01/20/1983    Years since quitting: 40.2   Smokeless tobacco: Never  Vaping Use   Vaping Use: Never used  Substance and Sexual Activity   Alcohol use: No    Alcohol/week: 0.0 standard drinks of alcohol   Drug use: No   Sexual activity: Not on file  Other Topics Concern   Not on file  Social History Narrative   Patient has remarried to [place] after former wife, Bonita Quin, passed.   Patient has  four adult children.   Patient is retired.   Patient has a college education.   Patient is right-handed.   Patient drinks a max of 2 sodas per week.   Social Determinants of Corporate investment banker  Strain: Not on file  Food Insecurity: Not on file  Transportation Needs: Not on file  Physical Activity: Not on file  Stress: Not on file  Social Connections: Not on file  Intimate Partner Violence: Not on file      PHYSICAL EXAM  Vitals:   03/24/23 1257  BP: 130/80  Pulse: 72  Weight: 265 lb 12.8 oz (120.6 kg)  Height: 5\' 6"  (1.676 m)   Body mass index is 42.9 kg/m.  Generalized: Well developed, in no acute distress  Chest: Lungs clear to auscultation bilaterally  Neurological examination  Mentation: Alert oriented to time, place, history taking. Follows all commands speech and language fluent Cranial nerve II-XII: Facial symmetry noted  DIAGNOSTIC DATA (LABS, IMAGING, TESTING) - I reviewed patient records, labs, notes, testing and imaging myself where available.  Lab Results  Component Value Date   WBC 11.3 (H) 10/03/2020   HGB 15.6 02/28/2023   HCT 46.0 02/28/2023   MCV 90.4 10/03/2020   PLT 263 10/03/2020      Component Value Date/Time   NA 141 02/28/2023 0848   K 3.2 (L) 02/28/2023 0848   CL 99 02/28/2023 0848   CO2 28 10/03/2020 1457   GLUCOSE 128 (H) 02/28/2023 0848   BUN 19 02/28/2023 0848   CREATININE 1.10 02/28/2023 0848   CALCIUM 9.0 10/03/2020 1457   PROT 7.5 10/04/2011 0957   ALBUMIN 4.0 10/04/2011 0957   AST 22 10/04/2011 0957   ALT 21 10/04/2011 0957   ALKPHOS 67 10/04/2011 0957   BILITOT 0.4 10/04/2011 0957   GFRNONAA >60 10/03/2020 1457   GFRAA >60 05/02/2020 1225       ASSESSMENT AND PLAN 82 y.o. year old male  has a past medical history of Arthritis, Eye problem, History of kidney stones, Hyperlipemia, Hypertension, Hypoxemia (09/02/2014), OSA on CPAP, PLMD (periodic limb movement disorder) (09/02/2014), Seasonal allergies, and  Sleep apnea. here with:  OSA on CPAP  - CPAP compliance excellent - Residual AHI slightly elevated most likely due to leakage patient will tighten his straps - Encourage patient to use CPAP nightly and > 4 hours each night - F/U in 1 year or sooner if needed   Butch Penny, MSN, NP-C 03/24/2023, 1:15 PM [facility] [address] [place], [place] [address] [phone]

## 2023-03-24 ENCOUNTER — Encounter: Payer: Self-pay | Admitting: Adult Health

## 2023-03-24 ENCOUNTER — Ambulatory Visit: Payer: Medicare HMO | Admitting: Adult Health

## 2023-03-24 VITALS — BP 130/80 | HR 72 | Ht 66.0 in | Wt 265.8 lb

## 2023-03-24 DIAGNOSIS — G4733 Obstructive sleep apnea (adult) (pediatric): Secondary | ICD-10-CM | POA: Diagnosis not present

## 2023-03-30 DIAGNOSIS — Z471 Aftercare following joint replacement surgery: Secondary | ICD-10-CM | POA: Diagnosis not present

## 2023-03-30 DIAGNOSIS — J9601 Acute respiratory failure with hypoxia: Secondary | ICD-10-CM | POA: Diagnosis not present

## 2023-03-30 DIAGNOSIS — Z96653 Presence of artificial knee joint, bilateral: Secondary | ICD-10-CM | POA: Diagnosis not present

## 2023-03-30 DIAGNOSIS — J90 Pleural effusion, not elsewhere classified: Secondary | ICD-10-CM | POA: Diagnosis not present

## 2023-03-30 DIAGNOSIS — M1612 Unilateral primary osteoarthritis, left hip: Secondary | ICD-10-CM | POA: Diagnosis not present

## 2023-03-30 DIAGNOSIS — M25552 Pain in left hip: Secondary | ICD-10-CM | POA: Diagnosis not present

## 2023-03-30 DIAGNOSIS — J811 Chronic pulmonary edema: Secondary | ICD-10-CM | POA: Diagnosis not present

## 2023-03-30 DIAGNOSIS — I10 Essential (primary) hypertension: Secondary | ICD-10-CM | POA: Diagnosis not present

## 2023-03-30 DIAGNOSIS — I484 Atypical atrial flutter: Secondary | ICD-10-CM | POA: Diagnosis not present

## 2023-03-30 DIAGNOSIS — E782 Mixed hyperlipidemia: Secondary | ICD-10-CM | POA: Diagnosis not present

## 2023-03-30 DIAGNOSIS — H34832 Tributary (branch) retinal vein occlusion, left eye, with macular edema: Secondary | ICD-10-CM | POA: Diagnosis not present

## 2023-03-30 DIAGNOSIS — G4733 Obstructive sleep apnea (adult) (pediatric): Secondary | ICD-10-CM | POA: Diagnosis not present

## 2023-03-30 DIAGNOSIS — Z20822 Contact with and (suspected) exposure to covid-19: Secondary | ICD-10-CM | POA: Diagnosis not present

## 2023-03-31 DIAGNOSIS — M1612 Unilateral primary osteoarthritis, left hip: Secondary | ICD-10-CM | POA: Diagnosis not present

## 2023-03-31 DIAGNOSIS — E782 Mixed hyperlipidemia: Secondary | ICD-10-CM | POA: Diagnosis not present

## 2023-03-31 DIAGNOSIS — H34832 Tributary (branch) retinal vein occlusion, left eye, with macular edema: Secondary | ICD-10-CM | POA: Diagnosis not present

## 2023-03-31 DIAGNOSIS — Z20822 Contact with and (suspected) exposure to covid-19: Secondary | ICD-10-CM | POA: Diagnosis not present

## 2023-03-31 DIAGNOSIS — G4733 Obstructive sleep apnea (adult) (pediatric): Secondary | ICD-10-CM | POA: Diagnosis not present

## 2023-03-31 DIAGNOSIS — J90 Pleural effusion, not elsewhere classified: Secondary | ICD-10-CM | POA: Diagnosis not present

## 2023-03-31 DIAGNOSIS — R0902 Hypoxemia: Secondary | ICD-10-CM | POA: Diagnosis not present

## 2023-03-31 DIAGNOSIS — I484 Atypical atrial flutter: Secondary | ICD-10-CM | POA: Diagnosis not present

## 2023-03-31 DIAGNOSIS — J9601 Acute respiratory failure with hypoxia: Secondary | ICD-10-CM | POA: Diagnosis not present

## 2023-03-31 DIAGNOSIS — I10 Essential (primary) hypertension: Secondary | ICD-10-CM | POA: Diagnosis not present

## 2023-03-31 DIAGNOSIS — M25552 Pain in left hip: Secondary | ICD-10-CM | POA: Diagnosis not present

## 2023-03-31 DIAGNOSIS — J811 Chronic pulmonary edema: Secondary | ICD-10-CM | POA: Diagnosis not present

## 2023-03-31 DIAGNOSIS — I3139 Other pericardial effusion (noninflammatory): Secondary | ICD-10-CM | POA: Diagnosis not present

## 2023-03-31 DIAGNOSIS — I2510 Atherosclerotic heart disease of native coronary artery without angina pectoris: Secondary | ICD-10-CM | POA: Diagnosis not present

## 2023-04-01 DIAGNOSIS — J811 Chronic pulmonary edema: Secondary | ICD-10-CM | POA: Diagnosis not present

## 2023-04-01 DIAGNOSIS — H34832 Tributary (branch) retinal vein occlusion, left eye, with macular edema: Secondary | ICD-10-CM | POA: Diagnosis not present

## 2023-04-01 DIAGNOSIS — I083 Combined rheumatic disorders of mitral, aortic and tricuspid valves: Secondary | ICD-10-CM | POA: Diagnosis not present

## 2023-04-01 DIAGNOSIS — J9601 Acute respiratory failure with hypoxia: Secondary | ICD-10-CM | POA: Diagnosis not present

## 2023-04-01 DIAGNOSIS — M1612 Unilateral primary osteoarthritis, left hip: Secondary | ICD-10-CM | POA: Diagnosis not present

## 2023-04-01 DIAGNOSIS — M25552 Pain in left hip: Secondary | ICD-10-CM | POA: Diagnosis not present

## 2023-04-01 DIAGNOSIS — Z20822 Contact with and (suspected) exposure to covid-19: Secondary | ICD-10-CM | POA: Diagnosis not present

## 2023-04-01 DIAGNOSIS — E782 Mixed hyperlipidemia: Secondary | ICD-10-CM | POA: Diagnosis not present

## 2023-04-01 DIAGNOSIS — J90 Pleural effusion, not elsewhere classified: Secondary | ICD-10-CM | POA: Diagnosis not present

## 2023-04-01 DIAGNOSIS — I484 Atypical atrial flutter: Secondary | ICD-10-CM | POA: Diagnosis not present

## 2023-04-25 DIAGNOSIS — H4052X3 Glaucoma secondary to other eye disorders, left eye, severe stage: Secondary | ICD-10-CM | POA: Diagnosis not present

## 2023-04-26 DIAGNOSIS — G4730 Sleep apnea, unspecified: Secondary | ICD-10-CM | POA: Diagnosis not present

## 2023-04-26 DIAGNOSIS — E785 Hyperlipidemia, unspecified: Secondary | ICD-10-CM | POA: Diagnosis not present

## 2023-04-26 DIAGNOSIS — R7301 Impaired fasting glucose: Secondary | ICD-10-CM | POA: Diagnosis not present

## 2023-04-26 DIAGNOSIS — R9431 Abnormal electrocardiogram [ECG] [EKG]: Secondary | ICD-10-CM | POA: Diagnosis not present

## 2023-04-26 DIAGNOSIS — I4892 Unspecified atrial flutter: Secondary | ICD-10-CM | POA: Diagnosis not present

## 2023-04-26 DIAGNOSIS — H34832 Tributary (branch) retinal vein occlusion, left eye, with macular edema: Secondary | ICD-10-CM | POA: Diagnosis not present

## 2023-04-26 DIAGNOSIS — I10 Essential (primary) hypertension: Secondary | ICD-10-CM | POA: Diagnosis not present

## 2023-05-03 DIAGNOSIS — M25552 Pain in left hip: Secondary | ICD-10-CM | POA: Diagnosis not present

## 2023-05-04 ENCOUNTER — Telehealth: Payer: Self-pay | Admitting: Adult Health

## 2023-05-04 NOTE — Telephone Encounter (Addendum)
I called wife of pt. Pt has had dizziness, then falls.  She was letting Aundra Millet NP know.   She states that pt had his bp med changed by pcp last week.  Has appt next week.  I told her to contact them and let them know.   He does not have any other new sx.  He  does have history of possible TIA , R eye vision loss.  I relayed that since we have not seen him for these issues would need to see MD if pcp is asking for neuro to see.  She would call pcp. She is aware to call 911 or go to ED if emergency.  She verbalized understanding. She appreciated call back.

## 2023-05-04 NOTE — Telephone Encounter (Signed)
Pt wife stated, pt is having dizzy spells where he get dizzy and just falls. Stated she wants to let Tommy [name] know what's going on with pt.

## 2023-05-10 DIAGNOSIS — I4892 Unspecified atrial flutter: Secondary | ICD-10-CM | POA: Diagnosis not present

## 2023-05-10 DIAGNOSIS — R42 Dizziness and giddiness: Secondary | ICD-10-CM | POA: Diagnosis not present

## 2023-05-10 DIAGNOSIS — I10 Essential (primary) hypertension: Secondary | ICD-10-CM | POA: Diagnosis not present

## 2023-05-30 DIAGNOSIS — R42 Dizziness and giddiness: Secondary | ICD-10-CM | POA: Diagnosis not present

## 2023-05-30 DIAGNOSIS — I10 Essential (primary) hypertension: Secondary | ICD-10-CM | POA: Diagnosis not present

## 2023-05-30 DIAGNOSIS — I4892 Unspecified atrial flutter: Secondary | ICD-10-CM | POA: Diagnosis not present

## 2023-06-02 ENCOUNTER — Other Ambulatory Visit: Payer: Self-pay | Admitting: Cardiology

## 2023-06-02 DIAGNOSIS — E782 Mixed hyperlipidemia: Secondary | ICD-10-CM

## 2023-06-22 ENCOUNTER — Ambulatory Visit: Payer: Medicare HMO | Admitting: Internal Medicine

## 2023-07-14 DIAGNOSIS — I10 Essential (primary) hypertension: Secondary | ICD-10-CM | POA: Diagnosis not present

## 2023-07-14 DIAGNOSIS — I4892 Unspecified atrial flutter: Secondary | ICD-10-CM | POA: Diagnosis not present

## 2023-07-14 DIAGNOSIS — R42 Dizziness and giddiness: Secondary | ICD-10-CM | POA: Diagnosis not present

## 2023-08-01 DIAGNOSIS — L218 Other seborrheic dermatitis: Secondary | ICD-10-CM | POA: Diagnosis not present

## 2023-08-01 DIAGNOSIS — D225 Melanocytic nevi of trunk: Secondary | ICD-10-CM | POA: Diagnosis not present

## 2023-08-01 DIAGNOSIS — D2262 Melanocytic nevi of left upper limb, including shoulder: Secondary | ICD-10-CM | POA: Diagnosis not present

## 2023-08-01 DIAGNOSIS — Z85828 Personal history of other malignant neoplasm of skin: Secondary | ICD-10-CM | POA: Diagnosis not present

## 2023-08-01 DIAGNOSIS — L820 Inflamed seborrheic keratosis: Secondary | ICD-10-CM | POA: Diagnosis not present

## 2023-08-01 DIAGNOSIS — L918 Other hypertrophic disorders of the skin: Secondary | ICD-10-CM | POA: Diagnosis not present

## 2023-08-01 DIAGNOSIS — L821 Other seborrheic keratosis: Secondary | ICD-10-CM | POA: Diagnosis not present

## 2023-08-16 DIAGNOSIS — I10 Essential (primary) hypertension: Secondary | ICD-10-CM | POA: Diagnosis not present

## 2023-08-16 DIAGNOSIS — Z23 Encounter for immunization: Secondary | ICD-10-CM | POA: Diagnosis not present

## 2023-08-17 DIAGNOSIS — I10 Essential (primary) hypertension: Secondary | ICD-10-CM | POA: Diagnosis not present

## 2023-08-17 DIAGNOSIS — G4730 Sleep apnea, unspecified: Secondary | ICD-10-CM | POA: Diagnosis not present

## 2023-08-18 ENCOUNTER — Ambulatory Visit: Payer: Medicare HMO | Admitting: Cardiology

## 2023-08-18 ENCOUNTER — Encounter: Payer: Self-pay | Admitting: Cardiology

## 2023-08-18 VITALS — BP 121/80 | HR 80 | Resp 16 | Ht 66.0 in | Wt 273.4 lb

## 2023-08-18 DIAGNOSIS — E782 Mixed hyperlipidemia: Secondary | ICD-10-CM | POA: Diagnosis not present

## 2023-08-18 DIAGNOSIS — E66813 Obesity, class 3: Secondary | ICD-10-CM

## 2023-08-18 DIAGNOSIS — I484 Atypical atrial flutter: Secondary | ICD-10-CM | POA: Diagnosis not present

## 2023-08-18 DIAGNOSIS — Z6841 Body Mass Index (BMI) 40.0 and over, adult: Secondary | ICD-10-CM | POA: Diagnosis not present

## 2023-08-18 NOTE — Progress Notes (Signed)
Patient referred by Chilton Greathouse, MD for atrial flutter  Subjective:   Tommy [name], male    DOB: 1941-09-26, 82 y.o.   MRN: [id number]   Chief Complaint  Patient presents with   Atypical atrial flutter    Follow-up    HPI  82 y.o. Caucasian male with hypertension, hyperlipidemia, obesity, OSA on CPAP, persistent atrial flutter.  Patient is here with his wife.  After his last visit with me in 02/2023, he underwent successful cardioversion for atrial flutter.  Subsequently, he underwent hip surgery.  According to him, all subsequent visits with PCP have showed normal rhythm.  He is in sinus rhythm again today.  He has had dizziness with standing up.  He is currently on low-dose metoprolol succinate, and amlodipine.    His biggest issue at this time remains obesity.  He has tried diet and lifestyle changes, as well as Ozempic and Mounjaro without being able to tolerate them.     Initial consultation visit 06/2022: Patient is here with his wife. He is retired, stays active with work around the house but does not do any regular exercise. He was recently found to have atrial flutter on routine visit with his PCP Dr. Felipa Eth. He denies chest pain, shortness of breath, palpitations, leg edema, orthopnea, PND, TIA/syncope.   He has OSA, uses CPAP regularly. 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   Current Outpatient Medications:    acetaminophen (TYLENOL) 500 MG tablet, Take 1,000 mg by mouth every 6 (six) hours as needed for moderate pain or headache., Disp: , Rfl:    amLODipine (NORVASC) 10 MG tablet, Take 10 mg by mouth every morning., Disp: , Rfl:    atorvastatin (LIPITOR) 40 MG tablet, TAKE 1 TABLET EVERY EVENING, Disp: 90 tablet, Rfl: 3   brimonidine (ALPHAGAN) 0.2 % ophthalmic solution, Place 1 drop into the left eye 3 (three) times daily. , Disp: , Rfl: 11   buPROPion  (WELLBUTRIN) 75 MG tablet, Take 75 mg by mouth daily., Disp: , Rfl:    Cholecalciferol (DIALYVITE VITAMIN D 5000) 125 MCG (5000 UT) capsule, Take 5,000 Units by mouth daily., Disp: , Rfl:    dorzolamide-timolol (COSOPT) 22.3-6.8 MG/ML ophthalmic solution, Place 1 drop into the left eye 2 (two) times daily. , Disp: , Rfl:    ELIQUIS 5 MG TABS tablet, Take 5 mg by mouth 2 (two) times daily., Disp: , Rfl:    fluticasone (FLONASE) 50 MCG/ACT nasal spray, Place 2 sprays into the nose daily as needed for allergies. , Disp: , Rfl:    methocarbamol (ROBAXIN) 500 MG tablet, Take 1 tablet (500 mg total) by mouth every 8 (eight) hours as needed for muscle spasms., Disp: 40 tablet, Rfl: 0   metoprolol succinate (TOPROL-XL) 25 MG 24 hr tablet, Take 25 mg by mouth daily., Disp: , Rfl:    Multiple Vitamin (MULTIVITAMIN) tablet, Take 1 tablet daily by mouth., Disp: , Rfl:    naproxen sodium (ALEVE) 220 MG tablet, Take 220 mg by mouth daily as needed (pain)., Disp: , Rfl:    nystatin-triamcinolone (MYCOLOG II) cream, Apply 1 application topically daily as needed (rash). , Disp: , Rfl:    tamsulosin (FLOMAX) 0.4 MG CAPS capsule, Take 0.4 mg by mouth at bedtime. , Disp: , Rfl:    telmisartan (MICARDIS) 80 MG tablet, Take 80 mg by mouth at bedtime. ,  Disp: , Rfl:    venlafaxine XR (EFFEXOR-XR) 75 MG 24 hr capsule, Take 150 mg by mouth daily. , Disp: , Rfl:    vitamin E 400 UNIT capsule, Take 400 Units daily by mouth., Disp: , Rfl:    zinc gluconate 50 MG tablet, Take 50 mg by mouth daily., Disp: , Rfl:    Cardiovascular and other pertinent studies:  Reviewed external labs and tests, independently interpreted  EKG 08/18/2023: Sinus rhythm 81 bpm  Incomplete right bundle branch block  Cardioversion 02/2023  EKG 02/17/2023: Atrial flutter 98 bpm ST depression -Nondiagnostic No change compared to previous EKG  Echocardiogram 07/30/2022:  Normal LV systolic function with visual EF 60-65%. Left ventricle cavity   is normal in size. Mild concentric hypertrophy of the left ventricle.  Normal global wall motion. Doppler evidence of grade I (impaired)  diastolic dysfunction, normal LAP. Calculated EF 61%.  Left atrial cavity is mildly dilated. A lipomatous septum is present.  Trileaflet aortic valve with no regurgitation. Sclerosis of the aortic  valve. Trace aortic valve stenosis.  Structurally normal tricuspid valve with trace regurgitation. No evidence  of pulmonary hypertension.   Recent labs: 02/18/2023: Glucose 128, BUN/Cr 19/1.10. EGFR NA. Na/K 141/3.2.  H/H 15/46.   Jan-April 2023: Glucose NA, BUN/Cr 14/1.1. EGFR 58. HbA1C 5.5% Chol 169, TG 194, HDL 29, LDL 101 TSH 1.1 normal    Review of Systems  Cardiovascular:  Negative for chest pain, dyspnea on exertion, leg swelling, palpitations and syncope.  Musculoskeletal:  Positive for joint pain.  Neurological:  Positive for dizziness.         Vitals:   08/18/23 0935  BP: 121/80  Pulse: 80  Resp: 16  SpO2: 97%      Body mass index is 44.13 kg/m. Filed Weights   08/18/23 0935  Weight: 273 lb 6.4 oz (124 kg)      Objective:   Physical Exam Vitals and nursing note reviewed.  Constitutional:      General: He is not in acute distress.    Appearance: He is obese.  Neck:     Vascular: No JVD.  Cardiovascular:     Rate and Rhythm: Normal rate and regular rhythm.     Heart sounds: Normal heart sounds. No murmur heard. Pulmonary:     Effort: Pulmonary effort is normal.     Breath sounds: Normal breath sounds. No wheezing or rales.  Musculoskeletal:     Right lower leg: No edema.     Left lower leg: No edema.         Visit diagnoses:   ICD-10-CM   1. Atypical atrial flutter (HCC)  I48.4 EKG 12-Lead    2. Mixed hyperlipidemia  E78.2     3. Class 3 severe obesity with serious comorbidity and body mass index (BMI) of 40.0 to 44.9 in adult, unspecified obesity type (HCC)  E66.01    Z68.41         Assessment  & Recommendations:   82 y.o. Caucasian male with hypertension, hyperlipidemia, obesity, OSA on CPAP, referred for management of atrial flutter.  Atrial flutter: S/p cardioversion in 02/2023. In sinus rhythm today, reportedly in sinus rhythm at PCP visits also. CHA2DS2VASC score at least 3, possible 5 (?prior stroke). However, in absence of any recurrent atrial flutter and any known atrial fibrillation, reasonable to discontinue anticoagulation. In light of his dizziness, will also discontinue his low-dose metoprolol and amlodipine today. Continue CPAP for OSA. I encouraged him to use a smart watch  to longitudinally monitor for any atrial flutter recurrence at home.  Mixed hyperlipidemia: Continue lipitor 40 mg daily.  Follow-up lipid panel with PCP.  Morbid obesity: Discussed cutting down on sugar added foods like ice cream, cookies.  If obesity persists in spite of diet and lifestyle changes, given that he has not tolerated any weight loss medications, could strongly consider surgical weight loss.  Encouraged discussion with Dr. Felipa Eth.  F/u in 6 months    Elder Negus, MD Pager: [phone] Office: [phone]

## 2023-09-01 DIAGNOSIS — G4730 Sleep apnea, unspecified: Secondary | ICD-10-CM | POA: Diagnosis not present

## 2023-09-01 DIAGNOSIS — E785 Hyperlipidemia, unspecified: Secondary | ICD-10-CM | POA: Diagnosis not present

## 2023-09-01 DIAGNOSIS — R7301 Impaired fasting glucose: Secondary | ICD-10-CM | POA: Diagnosis not present

## 2023-09-01 DIAGNOSIS — H34832 Tributary (branch) retinal vein occlusion, left eye, with macular edema: Secondary | ICD-10-CM | POA: Diagnosis not present

## 2023-09-01 DIAGNOSIS — J302 Other seasonal allergic rhinitis: Secondary | ICD-10-CM | POA: Diagnosis not present

## 2023-09-01 DIAGNOSIS — I10 Essential (primary) hypertension: Secondary | ICD-10-CM | POA: Diagnosis not present

## 2023-09-01 DIAGNOSIS — F39 Unspecified mood [affective] disorder: Secondary | ICD-10-CM | POA: Diagnosis not present

## 2023-09-13 DIAGNOSIS — H401123 Primary open-angle glaucoma, left eye, severe stage: Secondary | ICD-10-CM | POA: Diagnosis not present

## 2023-09-13 DIAGNOSIS — Z961 Presence of intraocular lens: Secondary | ICD-10-CM | POA: Diagnosis not present

## 2023-09-13 DIAGNOSIS — H3581 Retinal edema: Secondary | ICD-10-CM | POA: Diagnosis not present

## 2023-09-13 DIAGNOSIS — H34832 Tributary (branch) retinal vein occlusion, left eye, with macular edema: Secondary | ICD-10-CM | POA: Diagnosis not present

## 2023-09-13 DIAGNOSIS — H401111 Primary open-angle glaucoma, right eye, mild stage: Secondary | ICD-10-CM | POA: Diagnosis not present

## 2023-09-19 DIAGNOSIS — H4052X3 Glaucoma secondary to other eye disorders, left eye, severe stage: Secondary | ICD-10-CM | POA: Diagnosis not present

## 2023-09-27 DIAGNOSIS — H6123 Impacted cerumen, bilateral: Secondary | ICD-10-CM | POA: Diagnosis not present

## 2023-09-27 DIAGNOSIS — L2989 Other pruritus: Secondary | ICD-10-CM | POA: Diagnosis not present

## 2023-09-27 DIAGNOSIS — H93299 Other abnormal auditory perceptions, unspecified ear: Secondary | ICD-10-CM | POA: Diagnosis not present

## 2023-12-19 DIAGNOSIS — M533 Sacrococcygeal disorders, not elsewhere classified: Secondary | ICD-10-CM | POA: Diagnosis not present

## 2024-01-03 DIAGNOSIS — E785 Hyperlipidemia, unspecified: Secondary | ICD-10-CM | POA: Diagnosis not present

## 2024-01-04 DIAGNOSIS — R7301 Impaired fasting glucose: Secondary | ICD-10-CM | POA: Diagnosis not present

## 2024-01-04 DIAGNOSIS — E785 Hyperlipidemia, unspecified: Secondary | ICD-10-CM | POA: Diagnosis not present

## 2024-01-04 DIAGNOSIS — Z125 Encounter for screening for malignant neoplasm of prostate: Secondary | ICD-10-CM | POA: Diagnosis not present

## 2024-01-04 DIAGNOSIS — I10 Essential (primary) hypertension: Secondary | ICD-10-CM | POA: Diagnosis not present

## 2024-01-10 DIAGNOSIS — H34832 Tributary (branch) retinal vein occlusion, left eye, with macular edema: Secondary | ICD-10-CM | POA: Diagnosis not present

## 2024-01-10 DIAGNOSIS — I4892 Unspecified atrial flutter: Secondary | ICD-10-CM | POA: Diagnosis not present

## 2024-01-10 DIAGNOSIS — E1122 Type 2 diabetes mellitus with diabetic chronic kidney disease: Secondary | ICD-10-CM | POA: Diagnosis not present

## 2024-01-10 DIAGNOSIS — R42 Dizziness and giddiness: Secondary | ICD-10-CM | POA: Diagnosis not present

## 2024-01-10 DIAGNOSIS — N1831 Chronic kidney disease, stage 3a: Secondary | ICD-10-CM | POA: Diagnosis not present

## 2024-01-10 DIAGNOSIS — J302 Other seasonal allergic rhinitis: Secondary | ICD-10-CM | POA: Diagnosis not present

## 2024-01-10 DIAGNOSIS — E785 Hyperlipidemia, unspecified: Secondary | ICD-10-CM | POA: Diagnosis not present

## 2024-01-10 DIAGNOSIS — I10 Essential (primary) hypertension: Secondary | ICD-10-CM | POA: Diagnosis not present

## 2024-01-10 DIAGNOSIS — Z0000 Encounter for general adult medical examination without abnormal findings: Secondary | ICD-10-CM | POA: Diagnosis not present

## 2024-01-10 DIAGNOSIS — I129 Hypertensive chronic kidney disease with stage 1 through stage 4 chronic kidney disease, or unspecified chronic kidney disease: Secondary | ICD-10-CM | POA: Diagnosis not present

## 2024-01-10 DIAGNOSIS — M1611 Unilateral primary osteoarthritis, right hip: Secondary | ICD-10-CM | POA: Diagnosis not present

## 2024-01-10 DIAGNOSIS — G4730 Sleep apnea, unspecified: Secondary | ICD-10-CM | POA: Diagnosis not present

## 2024-01-17 DIAGNOSIS — G4733 Obstructive sleep apnea (adult) (pediatric): Secondary | ICD-10-CM | POA: Diagnosis not present

## 2024-01-24 DIAGNOSIS — R42 Dizziness and giddiness: Secondary | ICD-10-CM | POA: Diagnosis not present

## 2024-01-24 DIAGNOSIS — I129 Hypertensive chronic kidney disease with stage 1 through stage 4 chronic kidney disease, or unspecified chronic kidney disease: Secondary | ICD-10-CM | POA: Diagnosis not present

## 2024-01-24 DIAGNOSIS — N401 Enlarged prostate with lower urinary tract symptoms: Secondary | ICD-10-CM | POA: Diagnosis not present

## 2024-01-24 DIAGNOSIS — I4892 Unspecified atrial flutter: Secondary | ICD-10-CM | POA: Diagnosis not present

## 2024-01-24 DIAGNOSIS — N1831 Chronic kidney disease, stage 3a: Secondary | ICD-10-CM | POA: Diagnosis not present

## 2024-01-24 DIAGNOSIS — R350 Frequency of micturition: Secondary | ICD-10-CM | POA: Diagnosis not present

## 2024-02-10 DIAGNOSIS — G4733 Obstructive sleep apnea (adult) (pediatric): Secondary | ICD-10-CM | POA: Diagnosis not present

## 2024-02-11 DIAGNOSIS — Z6841 Body Mass Index (BMI) 40.0 and over, adult: Secondary | ICD-10-CM | POA: Diagnosis not present

## 2024-02-11 DIAGNOSIS — M1990 Unspecified osteoarthritis, unspecified site: Secondary | ICD-10-CM | POA: Diagnosis not present

## 2024-02-11 DIAGNOSIS — F3342 Major depressive disorder, recurrent, in full remission: Secondary | ICD-10-CM | POA: Diagnosis not present

## 2024-02-11 DIAGNOSIS — R42 Dizziness and giddiness: Secondary | ICD-10-CM | POA: Diagnosis not present

## 2024-02-11 DIAGNOSIS — I10 Essential (primary) hypertension: Secondary | ICD-10-CM | POA: Diagnosis not present

## 2024-02-11 DIAGNOSIS — M62838 Other muscle spasm: Secondary | ICD-10-CM | POA: Diagnosis not present

## 2024-02-11 DIAGNOSIS — E785 Hyperlipidemia, unspecified: Secondary | ICD-10-CM | POA: Diagnosis not present

## 2024-02-11 DIAGNOSIS — G4733 Obstructive sleep apnea (adult) (pediatric): Secondary | ICD-10-CM | POA: Diagnosis not present

## 2024-02-11 DIAGNOSIS — N400 Enlarged prostate without lower urinary tract symptoms: Secondary | ICD-10-CM | POA: Diagnosis not present

## 2024-02-15 ENCOUNTER — Ambulatory Visit: Payer: Medicare HMO | Attending: Cardiology | Admitting: Cardiology

## 2024-02-15 ENCOUNTER — Encounter: Payer: Self-pay | Admitting: Cardiology

## 2024-02-15 VITALS — BP 92/64 | HR 83 | Ht 65.0 in | Wt 272.8 lb

## 2024-02-15 DIAGNOSIS — R42 Dizziness and giddiness: Secondary | ICD-10-CM | POA: Diagnosis not present

## 2024-02-15 DIAGNOSIS — E782 Mixed hyperlipidemia: Secondary | ICD-10-CM

## 2024-02-15 DIAGNOSIS — I484 Atypical atrial flutter: Secondary | ICD-10-CM

## 2024-02-15 NOTE — Patient Instructions (Signed)
 Medication Instructions:   STOP TAKING AMLODIPINE NOW  *If you need a refill on your cardiac medications before your next appointment, please call your pharmacy*     Follow-Up: At [facility], you and your health needs are our priority.  As part of our continuing mission to provide you with exceptional heart care, we have created designated Provider Care Teams.  These Care Teams include your primary Cardiologist (physician) and Advanced Practice Providers (APPs -  Physician Assistants and Nurse Practitioners) who all work together to provide you with the care you need, when you need it.  We recommend signing up for the patient portal called "MyChart".  Sign up information is provided on this After Visit Summary.  MyChart is used to connect with patients for Virtual Visits (Telemedicine).  Patients are able to view lab/test results, encounter notes, upcoming appointments, etc.  Non-urgent messages can be sent to your provider as well.   To learn more about what you can do with MyChart, go to [email or web address].    Your next appointment:   6 month(s)  Provider:   DR. Rosemary Holms   Other Instructions    1st Floor: - Lobby - Registration  - Pharmacy  - Lab - Cafe  2nd Floor: - PV Lab - Diagnostic Testing (echo, CT, nuclear med)  3rd Floor: - Vacant  4th Floor: - TCTS (cardiothoracic surgery) - AFib Clinic - Structural Heart Clinic - Vascular Surgery  - Vascular Ultrasound  5th Floor: - [facility] (general and EP) - Clinical Pharmacy for coumadin, hypertension, lipid, weight-loss medications, and med management appointments    Valet parking services will be available as well.

## 2024-02-15 NOTE — Progress Notes (Signed)
 Cardiology Office Note:  .   Date:  02/15/2024  ID:  Tommy [name], DOB 11/15/1941, MRN [id number] PCP: Chilton Greathouse, MD  [facility] Providers Cardiologist:  Truett Mainland, MD PCP: Chilton Greathouse, MD  Chief Complaint  Patient presents with   Atrial Flutter      History of Present Illness: .    Tommy [name] is a 83 y.o. male with hypertension, hyperlipidemia, obesity, OSA on CPAP, persistent atrial flutter.   Patient is here with his wife today.  He reports episodes of lightheadedness especially when standing and walking.  He was reportedly orthostatic at Dr. Vicente Males visit in the past.  He is currently taking amlodipine 5 mg daily and term certain half tablet of 80 mg daily.    Vitals:   02/15/24 0952  BP: 92/64  Pulse: 83  SpO2: 95%     ROS:  Review of Systems  Cardiovascular:  Negative for chest pain, dyspnea on exertion, leg swelling, palpitations and syncope.  Neurological:  Positive for light-headedness.     Studies Reviewed: Marland Kitchen   EKG Interpretation Date/Time:  Wednesday February 15 2024 09:47:33 EST Ventricular Rate:  85 PR Interval:  176 QRS Duration:  116 QT Interval:  386 QTC Calculation: 459 R Axis:   -20  Text Interpretation: EKG 02/15/2024:  Sinus rhythm with Premature atrial complexes Incomplete right bundle branch block Minimal voltage criteria for LVH, may be normal variant ( R in aVL ) When compared with ECG of 28-Feb-2023 09:23, Premature atrial complexes are now Present   Confirmed by Truett Mainland [phone]) on 02/15/2024 9:51:47 AM    EKG 02/15/2024: Sinus rhythm with Premature atrial complexes Incomplete right bundle branch block Minimal voltage criteria for LVH, may be normal variant ( R in aVL ) When compared with ECG of 28-Feb-2023 09:23, Premature atrial complexes are now Present  Recent labs: Independently interpreted 12/2023: Chol 140, TG 223, HDL 35, LDL 60 HbA1C 5.9% Hb 15.6 Cr 1.1, K 3.5   EKG  08/18/2023: Sinus rhythm 81 bpm  Incomplete right bundle branch block   Cardioversion 02/2023   EKG 02/17/2023: Atrial flutter 98 bpm ST depression -Nondiagnostic No change compared to previous EKG   Echocardiogram 07/30/2022:  Normal LV systolic function with visual EF 60-65%. Left ventricle cavity  is normal in size. Mild concentric hypertrophy of the left ventricle.  Normal global wall motion. Doppler evidence of grade I (impaired)  diastolic dysfunction, normal LAP. Calculated EF 61%.  Left atrial cavity is mildly dilated. A lipomatous septum is present.  Trileaflet aortic valve with no regurgitation. Sclerosis of the aortic  valve. Trace aortic valve stenosis.  Structurally normal tricuspid valve with trace regurgitation. No evidence  of pulmonary hypertension.    Risk Assessment/Calculations:    CHA2DS2-VASc Score = 5  This indicates a 9.7% annual risk of stroke. The patient's score is based upon: CHF History: 0 HTN History: 1 Diabetes History: 1 Stroke History: 2 Vascular Disease History: 0 Age Score: 2 Gender Score: 0    Physical Exam:   Physical Exam Vitals and nursing note reviewed.  Constitutional:      General: He is not in acute distress. Neck:     Vascular: No JVD.  Cardiovascular:     Rate and Rhythm: Normal rate and regular rhythm.     Heart sounds: Normal heart sounds. No murmur heard. Pulmonary:     Effort: Pulmonary effort is normal.     Breath sounds: Normal breath sounds. No wheezing or rales.  Musculoskeletal:     Right lower leg: No edema.     Left lower leg: No edema.      VISIT DIAGNOSES:   ICD-10-CM   1. Atypical atrial flutter (HCC)  I48.4 EKG 12-Lead    2. Mixed hyperlipidemia  E78.2 EKG 12-Lead    3. Lightheadedness  R42        ASSESSMENT AND PLAN: .    Tommy [name] is a 83 y.o. male with hypertension, hyperlipidemia, obesity, OSA on CPAP, referred for management of atrial flutter.   Lightheadedness: Blood pressure  92/60 mmHg today. Even if he were to have supine hypertension and orthostatic hypotension, adding amlodipine will likely make it worse and also contribute to his symptoms of lightheadedness. I have stopped amlodipine. Continue Telmisartan 1/2 tab of 80 mg daily.now.  He has follow-up with Dr. Felipa Eth in May 2024 where this can be assessed further.  Atrial flutter: S/p cardioversion in 02/2023. Maintaining sinus rhythm. CHA2DS2VASC score at least 3, possible 5 (?prior stroke), but now off anticoagulation after successful cardioversion in 02/2023 with subsequent maintenance of sinus rhythm. Continue CPAP for OSA. I encouraged him to use a smart watch to longitudinally monitor for any atrial flutter recurrence at home.  In case of recurrence of atrial flutter, he will need anticoagulation again.   Mixed hyperlipidemia: Continue lipitor 40 mg daily.  LDL well-controlled but triglycerides elevated on recent lipid panel in 12/2023.  Discussed low carbohydrate, low saturated fat diet.  I have encouraged him to fast before his upcoming visit and labs with Dr. Felipa Eth in 04/2024.  Morbid obesity: He has tried to cut down intake of simple carbohydrates.  He has tried [employer], but had significant side effects including lightheadedness.       No orders of the defined types were placed in this encounter.    F/u in 6 months  Signed, Elder Negus, MD

## 2024-03-22 ENCOUNTER — Ambulatory Visit: Payer: Medicare HMO | Admitting: Adult Health

## 2024-04-04 DIAGNOSIS — H4052X3 Glaucoma secondary to other eye disorders, left eye, severe stage: Secondary | ICD-10-CM | POA: Diagnosis not present

## 2024-04-04 DIAGNOSIS — S0502XA Injury of conjunctiva and corneal abrasion without foreign body, left eye, initial encounter: Secondary | ICD-10-CM | POA: Diagnosis not present

## 2024-04-05 ENCOUNTER — Ambulatory Visit: Payer: Medicare HMO | Admitting: Neurology

## 2024-04-09 ENCOUNTER — Telehealth: Payer: Self-pay | Admitting: Neurology

## 2024-04-09 NOTE — Telephone Encounter (Signed)
 Called the pt to advise to bring machine and power cord to the visit tomorrow. There was no answer LVM advising this.

## 2024-04-10 ENCOUNTER — Encounter: Payer: Self-pay | Admitting: Neurology

## 2024-04-10 ENCOUNTER — Ambulatory Visit: Payer: Medicare HMO | Admitting: Neurology

## 2024-04-12 NOTE — Telephone Encounter (Signed)
 [name], wife rescheduled appt on 06/25/24

## 2024-05-02 DIAGNOSIS — E1122 Type 2 diabetes mellitus with diabetic chronic kidney disease: Secondary | ICD-10-CM | POA: Diagnosis not present

## 2024-05-02 DIAGNOSIS — G4730 Sleep apnea, unspecified: Secondary | ICD-10-CM | POA: Diagnosis not present

## 2024-05-02 DIAGNOSIS — H409 Unspecified glaucoma: Secondary | ICD-10-CM | POA: Diagnosis not present

## 2024-05-02 DIAGNOSIS — N401 Enlarged prostate with lower urinary tract symptoms: Secondary | ICD-10-CM | POA: Diagnosis not present

## 2024-05-02 DIAGNOSIS — J302 Other seasonal allergic rhinitis: Secondary | ICD-10-CM | POA: Diagnosis not present

## 2024-05-02 DIAGNOSIS — I4892 Unspecified atrial flutter: Secondary | ICD-10-CM | POA: Diagnosis not present

## 2024-05-02 DIAGNOSIS — Z6841 Body Mass Index (BMI) 40.0 and over, adult: Secondary | ICD-10-CM | POA: Diagnosis not present

## 2024-05-02 DIAGNOSIS — I129 Hypertensive chronic kidney disease with stage 1 through stage 4 chronic kidney disease, or unspecified chronic kidney disease: Secondary | ICD-10-CM | POA: Diagnosis not present

## 2024-05-02 DIAGNOSIS — R42 Dizziness and giddiness: Secondary | ICD-10-CM | POA: Diagnosis not present

## 2024-05-02 DIAGNOSIS — H34832 Tributary (branch) retinal vein occlusion, left eye, with macular edema: Secondary | ICD-10-CM | POA: Diagnosis not present

## 2024-05-02 DIAGNOSIS — N1831 Chronic kidney disease, stage 3a: Secondary | ICD-10-CM | POA: Diagnosis not present

## 2024-05-16 DIAGNOSIS — H5462 Unqualified visual loss, left eye, normal vision right eye: Secondary | ICD-10-CM | POA: Diagnosis not present

## 2024-05-16 DIAGNOSIS — H34832 Tributary (branch) retinal vein occlusion, left eye, with macular edema: Secondary | ICD-10-CM | POA: Diagnosis not present

## 2024-05-16 DIAGNOSIS — H4052X3 Glaucoma secondary to other eye disorders, left eye, severe stage: Secondary | ICD-10-CM | POA: Diagnosis not present

## 2024-05-16 DIAGNOSIS — H5712 Ocular pain, left eye: Secondary | ICD-10-CM | POA: Diagnosis not present

## 2024-05-16 DIAGNOSIS — H1812 Bullous keratopathy, left eye: Secondary | ICD-10-CM | POA: Diagnosis not present

## 2024-05-16 DIAGNOSIS — Z961 Presence of intraocular lens: Secondary | ICD-10-CM | POA: Diagnosis not present

## 2024-05-22 DIAGNOSIS — H5440 Blindness, one eye, unspecified eye: Secondary | ICD-10-CM | POA: Diagnosis not present

## 2024-05-22 DIAGNOSIS — H4052X3 Glaucoma secondary to other eye disorders, left eye, severe stage: Secondary | ICD-10-CM | POA: Diagnosis not present

## 2024-05-22 DIAGNOSIS — H8110 Benign paroxysmal vertigo, unspecified ear: Secondary | ICD-10-CM | POA: Diagnosis not present

## 2024-05-22 DIAGNOSIS — G4733 Obstructive sleep apnea (adult) (pediatric): Secondary | ICD-10-CM | POA: Diagnosis not present

## 2024-05-22 DIAGNOSIS — H401111 Primary open-angle glaucoma, right eye, mild stage: Secondary | ICD-10-CM | POA: Diagnosis not present

## 2024-05-22 DIAGNOSIS — H5712 Ocular pain, left eye: Secondary | ICD-10-CM | POA: Diagnosis not present

## 2024-05-29 DIAGNOSIS — G4733 Obstructive sleep apnea (adult) (pediatric): Secondary | ICD-10-CM | POA: Diagnosis not present

## 2024-06-07 DIAGNOSIS — S0502XA Injury of conjunctiva and corneal abrasion without foreign body, left eye, initial encounter: Secondary | ICD-10-CM | POA: Diagnosis not present

## 2024-06-07 DIAGNOSIS — H4052X3 Glaucoma secondary to other eye disorders, left eye, severe stage: Secondary | ICD-10-CM | POA: Diagnosis not present

## 2024-06-14 DIAGNOSIS — N401 Enlarged prostate with lower urinary tract symptoms: Secondary | ICD-10-CM | POA: Diagnosis not present

## 2024-06-14 DIAGNOSIS — R3911 Hesitancy of micturition: Secondary | ICD-10-CM | POA: Diagnosis not present

## 2024-06-25 ENCOUNTER — Ambulatory Visit: Admitting: Neurology

## 2024-06-27 DIAGNOSIS — Z79899 Other long term (current) drug therapy: Secondary | ICD-10-CM | POA: Diagnosis not present

## 2024-06-27 DIAGNOSIS — H179 Unspecified corneal scar and opacity: Secondary | ICD-10-CM | POA: Diagnosis not present

## 2024-06-27 DIAGNOSIS — H5462 Unqualified visual loss, left eye, normal vision right eye: Secondary | ICD-10-CM | POA: Diagnosis not present

## 2024-06-27 DIAGNOSIS — I10 Essential (primary) hypertension: Secondary | ICD-10-CM | POA: Diagnosis not present

## 2024-06-27 DIAGNOSIS — Z87891 Personal history of nicotine dependence: Secondary | ICD-10-CM | POA: Diagnosis not present

## 2024-06-27 DIAGNOSIS — E785 Hyperlipidemia, unspecified: Secondary | ICD-10-CM | POA: Diagnosis not present

## 2024-06-27 DIAGNOSIS — H5440 Blindness, one eye, unspecified eye: Secondary | ICD-10-CM | POA: Diagnosis not present

## 2024-06-27 DIAGNOSIS — F419 Anxiety disorder, unspecified: Secondary | ICD-10-CM | POA: Diagnosis not present

## 2024-06-27 DIAGNOSIS — H5712 Ocular pain, left eye: Secondary | ICD-10-CM | POA: Diagnosis not present

## 2024-06-27 DIAGNOSIS — N401 Enlarged prostate with lower urinary tract symptoms: Secondary | ICD-10-CM | POA: Diagnosis not present

## 2024-06-29 DIAGNOSIS — Z48810 Encounter for surgical aftercare following surgery on the sense organs: Secondary | ICD-10-CM | POA: Diagnosis not present

## 2024-07-03 DIAGNOSIS — H5462 Unqualified visual loss, left eye, normal vision right eye: Secondary | ICD-10-CM | POA: Diagnosis not present

## 2024-07-03 DIAGNOSIS — Z96653 Presence of artificial knee joint, bilateral: Secondary | ICD-10-CM | POA: Diagnosis not present

## 2024-07-03 DIAGNOSIS — N400 Enlarged prostate without lower urinary tract symptoms: Secondary | ICD-10-CM | POA: Diagnosis not present

## 2024-07-03 DIAGNOSIS — E785 Hyperlipidemia, unspecified: Secondary | ICD-10-CM | POA: Diagnosis not present

## 2024-07-03 DIAGNOSIS — F419 Anxiety disorder, unspecified: Secondary | ICD-10-CM | POA: Diagnosis not present

## 2024-07-03 DIAGNOSIS — L03213 Periorbital cellulitis: Secondary | ICD-10-CM | POA: Diagnosis not present

## 2024-07-03 DIAGNOSIS — Z96611 Presence of right artificial shoulder joint: Secondary | ICD-10-CM | POA: Diagnosis not present

## 2024-07-03 DIAGNOSIS — B964 Proteus (mirabilis) (morganii) as the cause of diseases classified elsewhere: Secondary | ICD-10-CM | POA: Diagnosis not present

## 2024-07-03 DIAGNOSIS — Z96642 Presence of left artificial hip joint: Secondary | ICD-10-CM | POA: Diagnosis not present

## 2024-07-03 DIAGNOSIS — B9561 Methicillin susceptible Staphylococcus aureus infection as the cause of diseases classified elsewhere: Secondary | ICD-10-CM | POA: Diagnosis not present

## 2024-07-03 DIAGNOSIS — F32A Depression, unspecified: Secondary | ICD-10-CM | POA: Diagnosis not present

## 2024-07-03 DIAGNOSIS — B999 Unspecified infectious disease: Secondary | ICD-10-CM | POA: Diagnosis not present

## 2024-07-03 DIAGNOSIS — H05232 Hemorrhage of left orbit: Secondary | ICD-10-CM | POA: Diagnosis not present

## 2024-07-03 DIAGNOSIS — E876 Hypokalemia: Secondary | ICD-10-CM | POA: Diagnosis not present

## 2024-07-03 DIAGNOSIS — I10 Essential (primary) hypertension: Secondary | ICD-10-CM | POA: Diagnosis not present

## 2024-07-03 DIAGNOSIS — Z96612 Presence of left artificial shoulder joint: Secondary | ICD-10-CM | POA: Diagnosis not present

## 2024-07-03 DIAGNOSIS — H05012 Cellulitis of left orbit: Secondary | ICD-10-CM | POA: Diagnosis not present

## 2024-07-03 DIAGNOSIS — N179 Acute kidney failure, unspecified: Secondary | ICD-10-CM | POA: Diagnosis not present

## 2024-07-03 DIAGNOSIS — T8149XA Infection following a procedure, other surgical site, initial encounter: Secondary | ICD-10-CM | POA: Diagnosis not present

## 2024-07-03 DIAGNOSIS — T8579XA Infection and inflammatory reaction due to other internal prosthetic devices, implants and grafts, initial encounter: Secondary | ICD-10-CM | POA: Diagnosis not present

## 2024-07-03 DIAGNOSIS — H5712 Ocular pain, left eye: Secondary | ICD-10-CM | POA: Diagnosis not present

## 2024-07-04 DIAGNOSIS — H05232 Hemorrhage of left orbit: Secondary | ICD-10-CM | POA: Diagnosis not present

## 2024-07-04 DIAGNOSIS — H05012 Cellulitis of left orbit: Secondary | ICD-10-CM | POA: Diagnosis not present

## 2024-07-10 DIAGNOSIS — Z09 Encounter for follow-up examination after completed treatment for conditions other than malignant neoplasm: Secondary | ICD-10-CM | POA: Diagnosis not present

## 2024-07-17 DIAGNOSIS — F39 Unspecified mood [affective] disorder: Secondary | ICD-10-CM | POA: Diagnosis not present

## 2024-07-17 DIAGNOSIS — E1122 Type 2 diabetes mellitus with diabetic chronic kidney disease: Secondary | ICD-10-CM | POA: Diagnosis not present

## 2024-07-17 DIAGNOSIS — N1831 Chronic kidney disease, stage 3a: Secondary | ICD-10-CM | POA: Diagnosis not present

## 2024-07-17 DIAGNOSIS — I4892 Unspecified atrial flutter: Secondary | ICD-10-CM | POA: Diagnosis not present

## 2024-07-17 DIAGNOSIS — I129 Hypertensive chronic kidney disease with stage 1 through stage 4 chronic kidney disease, or unspecified chronic kidney disease: Secondary | ICD-10-CM | POA: Diagnosis not present

## 2024-07-17 DIAGNOSIS — Z1389 Encounter for screening for other disorder: Secondary | ICD-10-CM | POA: Diagnosis not present

## 2024-07-17 DIAGNOSIS — K121 Other forms of stomatitis: Secondary | ICD-10-CM | POA: Diagnosis not present

## 2024-07-17 DIAGNOSIS — K149 Disease of tongue, unspecified: Secondary | ICD-10-CM | POA: Diagnosis not present

## 2024-08-06 DIAGNOSIS — Q111 Other anophthalmos: Secondary | ICD-10-CM | POA: Diagnosis not present

## 2024-08-06 DIAGNOSIS — Z09 Encounter for follow-up examination after completed treatment for conditions other than malignant neoplasm: Secondary | ICD-10-CM | POA: Diagnosis not present

## 2024-08-06 DIAGNOSIS — H401111 Primary open-angle glaucoma, right eye, mild stage: Secondary | ICD-10-CM | POA: Diagnosis not present

## 2024-08-28 ENCOUNTER — Ambulatory Visit: Admitting: Neurology

## 2024-08-28 DIAGNOSIS — G4733 Obstructive sleep apnea (adult) (pediatric): Secondary | ICD-10-CM | POA: Diagnosis not present

## 2024-09-12 ENCOUNTER — Ambulatory Visit: Admitting: Neurology

## 2024-09-12 ENCOUNTER — Encounter: Payer: Self-pay | Admitting: Neurology

## 2024-09-12 VITALS — BP 140/92 | HR 80 | Ht 65.0 in | Wt 279.2 lb

## 2024-09-12 DIAGNOSIS — G4733 Obstructive sleep apnea (adult) (pediatric): Secondary | ICD-10-CM | POA: Diagnosis not present

## 2024-09-12 MED ORDER — ALPRAZOLAM 0.5 MG PO TABS: 0.5000 mg | ORAL_TABLET | Freq: Every evening | ORAL | 0 refills | Status: AC | PRN

## 2024-09-12 NOTE — Patient Instructions (Signed)
 I would like to thank Tommy Santos, MD and Tommy Manuelita RAMAN, Np [address] [place],  [place] [address] for allowing me to meet with Tommy [name]     Glaucoma  Glaucoma happens when the fluid pressure in your eye is too high. If the pressure stays high for too long, your eye may get damaged. This can cause a loss of vision. There are two main types of glaucoma: Open-angle glaucoma. This is the most common type. It causes pressure in the eye to go up slowly. There may be no symptoms at first. Testing can help find it before there is any damage. Early treatment can often stop vision loss. Acute angle-closure glaucoma. With this type, the pressure inside the eye rises fast to a very high level. This causes very bad pain. It needs to be treated right away. What are the causes? In many cases, the cause isn't known.  Some other diseases can sometimes lead to glaucoma. These include infection, cataracts, and tumors. What increases the risk? Being older than 81 years of age. High blood pressure or diabetes. Having a family history of glaucoma. An eye injury or eye surgery in the past. Being farsighted. Some medicines. What are the signs or symptoms? Open-angle glaucoma often causes no symptoms early on. If it's not treated, the condition: Will get worse and may cause a loss of side vision, also called peripheral vision. Can lead to tunnel vision. This means that you're able to see straight ahead but you have a loss of side vision in all directions. May lead to a total loss of vision. Symptoms of acute angle-closure glaucoma happen fast. They may include: Cloudy vision. Very bad pain in your eye. A very bad headache in the area near your eye. Feeling like you may vomit. Being off- balance , vertigo.  How is this diagnosed? Glaucoma is diagnosed with an eye exam. This will include: A test to measure the pressure in your eye. Eye tests to check your vision. Using tools to look inside  your eye. Glaucoma is often found during a routine eye exam. How is this treated? Early treatment is important to prevent vision loss. Treatment will help lower the pressure in your eyes. You'll likely need to use medicines in the form of eye drops. Laser treatments or other types of surgery may be needed in some cases. Acute angle-closure glaucoma needs emergency treatment to help prevent vision loss. Follow these instructions at home: Medicines Take medicines only as told by your health care provider. If you were given eye drops, use them exactly as told. You'll likely need to use this medicine for the rest of your life. Activity Exercise often. Talk with your provider about which types of exercise are safe for you. You may need to avoid: Headstands. Yoga poses with your head lower than other parts of your body. Contact a health care provider if: Your symptoms get worse. You have new symptoms. Get help right away if: You have very bad pain in your eye. You have vision problems. You have a bad headache in the area near your eye. You feel like you may vomit. You vomit. You start to have the same problems with your other eye. This information is not intended to replace advice given to you by your health care provider. Make sure you discuss any questions you have with your health care provider. Document Revised: 09/01/2023 Document Reviewed: 01/25/2023 Elsevier Patient Education  2024 [employer]   1) This patient is  a long time established CPAP patient in need of a new machine _Risk factors for OSA were persistently present,  including : , neck size and upper airway anatomy. He reports  hypersomnia.  He wears an eye-patch and will need a mask that prevents the surgical site from air leaks.  Orthopnea at home, BED 2  needed. HST and SPLIT study ordered.     2)  dizziness has resolved, no limp, no drift, normal posture and all improved after eye enucleation- I suspect that the   glaucoma and retinal infarct  let to  increased orbital pressure, which was causing vertigo,  he would lean forward, and fell forward.  Multiple falls but today none of these sensations was present, normal gait exam.   3) he still needs to get used to monovision.    4) dry mouth due to  urological meds.      RV in 4-5 months for new CPAP visit with NP,

## 2024-09-12 NOTE — Progress Notes (Signed)
 @[facility]   Provider:  Dedra Gores, MD    Primary Care Physician:  Avva, Ravisankar, MD [address] [place] [address]   Referring Provider: Jama Manuelita RAMAN, Np [address] [place],  [place] [address]        Chief Concern for this Consultation:   Patient presents with          HPI: I have the pleasure of meeting with Tommy [name], on 09-12-24, who is a 83 y.o.  male patient,  seen upon a referral by PCP   for a  Sleep Medicine Consultation.  The patient's referral information asked for a evaluation of dizziness . He stated he is here  for a new CPAP. SABRA  Patient is here for dizziness - July was when he had a bad episode.  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 ATB treatment vancomycin  in hospital - Severe illness , lost 15 pounds. Postop check , aquiered Anophthalmos of left eye;  Primary open angle glaucoma of right eye, mild stage  He was admitted to [facility] [place] [place]:  surgery center.  He  has a past medical history of Arthritis, Eye problem, History of kidney stones, Hyperlipemia, Hypertension, Hypoxemia (09/02/2014), OSA on CPAP, PLMD (periodic limb movement disorder) (09/02/2014), Seasonal allergies, and Sleep apnea.SABRA   ENT surgery or problems: (Sinusitis, Tonsillectomy),  OSA ON CPAP, recent infection,  REM enactment  while on antibiotic, OBESITY, several falls , injured the left arm/ elbow,  no fracture , no head Trauma, past Hx of GERD, always dry mouth, urinary incontinence.      This patient had a previous sleep study/ studies in the year 2015, SPLIT study-  machine was issued in 2017-2018, now due to be replaced.      Review of Systems: Out of a complete 14 system review, the patient complains  of only the following symptoms, and all other reviewed systems are negative.:    How likely are you to doze in the following situations: 0 = not likely, 1 = slight chance, 2 = moderate chance, 3 = high chance Sitting and Reading? 2 Watching Television? 2 Sitting inactive in a public place (theater or meeting)?2 As a passenger in a car for an hour without a break?2 Lying down in the afternoon when circumstances permit?3 Sitting and talking to someone?0 Sitting quietly after lunch without alcohol ?1-2 In a car, while stopped for a few minutes in traffic?0   Total ESS =12 / 24 points.    FSS endorsed at 43/ 63 points.    Nocturia 0-1  GDS:  Social History   Socioeconomic History   Marital status: Married    Spouse name: Tommy [name]   Number of children: 4   Years of education: 13   Highest education level: Not on file  Occupational History   Not on file  Tobacco Use   Smoking status: Former    Current packs/day: 0.00    Average packs/day: 0.5 packs/day for 8.0 years (4.0 ttl pk-yrs)    Types: Cigarettes    Start date: 01/20/1975    Quit date: 01/20/1983    Years since quitting: 41.6   Smokeless tobacco: Never  Vaping Use   Vaping status: Never  Used  Substance and Sexual Activity   Alcohol  use: No    Alcohol /week: 0.0 standard drinks of alcohol    Drug use: No   Sexual activity: Not on file  Other Topics Concern   Not on file  Social History Narrative   Patient has remarried to [place] after former wife, Rock, passed.   Patient has four adult children.   Patient is retired.   Patient has a college education.   Patient is right-handed.   Patient drinks a max of 2 sodas per week.   Social Drivers of Corporate investment banker Strain: Not on file  Food Insecurity: Low Risk  (07/04/2024)   Received from [facility]   Hunger Vital Sign    Within the past 12 months, you worried that your food would run out before you got money to buy more: Never true    Within the past 12  months, the food you bought just didn't last and you didn't have money to get more. : Never true  Transportation Needs: No Transportation Needs (07/04/2024)   Received from [facility]    In the past 12 months, has lack of reliable transportation kept you from medical appointments, meetings, work or from getting things needed for daily living? : No  Physical Activity: Not on file  Stress: No Stress Concern Present (03/30/2023)   Received from [facility] - Occupational Stress Questionnaire    Feeling of Stress : Only a little  Social Connections: Unknown (02/23/2023)   Received from [facility]   Social Network    Social Network: Not on file    Family History  Problem Relation Age of Onset   Alzheimer's disease Mother    Dementia Mother    Glaucoma Father    Heart Problems Maternal Grandmother    Heart Problems Maternal Grandfather    Sleep apnea Neg Hx     Past Medical History:  Diagnosis Date   Arthritis    Eye problem    treating for blood clot behind the retina in L eye   History of kidney stones    Hyperlipemia    Hypertension    Hypoxemia 09/02/2014   OSA on CPAP    PLMD (periodic limb movement disorder) 09/02/2014   Seasonal allergies    Sleep apnea    uses a cpap    Past Surgical History:  Procedure Laterality Date   BUNIONECTOMY Right 01/03/2014   Procedure: RIGHT LAPIDUS BUNION CORRECTION;  Surgeon: Norleen Armor, MD;  Location: [facility];  Service: Orthopedics;  Laterality: Right;   CARDIOVERSION N/A 02/28/2023   Procedure: CARDIOVERSION;  Surgeon: Dewane Shiner, DO;  Location: [facility];  Service: Cardiovascular;  Laterality: N/A;   JOINT REPLACEMENT     bilateral knee replacemts   JOINT REPLACEMENT     bilateral shoulder replacemts   KNEE ARTHROSCOPY     right and left   SHOULDER ARTHROSCOPY  12/13/2009   right   TOTAL HIP ARTHROPLASTY Right    TOTAL KNEE  ARTHROPLASTY  12/14/2003   right   TOTAL KNEE ARTHROPLASTY  12/13/2002   left   TOTAL SHOULDER ARTHROPLASTY  12/13/2010   left   TRICEPS TENDON REPAIR Left 05/02/2020   Procedure: Left triceps tendon repair reconstruction as necessary with bursectomy and ulnar nerve release at elbow;  Surgeon: Camella Fallow, MD;  Location: [facility] OR;  Service: Orthopedics;  Laterality: Left;  2 hrs  Current Outpatient Medications on File Prior to Visit  Medication Sig Dispense Refill   acetaminophen  (TYLENOL ) 500 MG tablet Take 1,000 mg by mouth every 6 (six) hours as needed for moderate pain or headache.     atorvastatin  (LIPITOR) 40 MG tablet TAKE 1 TABLET EVERY EVENING (Patient taking differently: Take 20 mg by mouth every evening.) 90 tablet 3   buPROPion (WELLBUTRIN) 75 MG tablet Take 75 mg by mouth daily.     Cholecalciferol (DIALYVITE VITAMIN D 5000) 125 MCG (5000 UT) capsule Take 5,000 Units by mouth daily.     meclizine (ANTIVERT) 25 MG tablet 1 tablet as needed for dizziness Oral every 8 hours for 30 days     methocarbamol  (ROBAXIN ) 500 MG tablet Take 1 tablet (500 mg total) by mouth every 8 (eight) hours as needed for muscle spasms. 40 tablet 0   Multiple Vitamin (MULTIVITAMIN) tablet Take 1 tablet daily by mouth.     naproxen sodium (ALEVE) 220 MG tablet Take 220 mg by mouth daily as needed (pain).     nystatin-triamcinolone  (MYCOLOG II) cream Apply 1 application topically daily as needed (rash).      tamsulosin  (FLOMAX ) 0.4 MG CAPS capsule Take 0.4 mg by mouth at bedtime.      telmisartan (MICARDIS) 80 MG tablet Take 40 mg by mouth daily.     venlafaxine  XR (EFFEXOR -XR) 75 MG 24 hr capsule Take 150 mg by mouth daily.      vitamin E 400 UNIT capsule Take 400 Units daily by mouth.     zinc  gluconate 50 MG tablet Take 50 mg by mouth daily.     brimonidine  (ALPHAGAN ) 0.2 % ophthalmic solution Place 1 drop into the left eye 3 (three) times daily.  (Patient not taking: Reported on 09/12/2024)  11    dorzolamide -timolol  (COSOPT ) 22.3-6.8 MG/ML ophthalmic solution Place 1 drop into the left eye 2 (two) times daily.  (Patient not taking: Reported on 09/12/2024)     fluticasone (FLONASE) 50 MCG/ACT nasal spray Place 2 sprays into the nose daily as needed for allergies.  (Patient not taking: Reported on 09/12/2024)     No current facility-administered medications on file prior to visit.    Allergies  Allergen Reactions   Ozempic  (0.25 Or 0.5 Mg-Dose) [Semaglutide (0.25 Or 0.5mg -Dos)] Nausea And Vomiting    Dizziness, stumble and fall     Vitals:   09/12/24 1104  BP: (!) 140/92  Pulse: 80  SpO2: 95%      Physical exam:   General: The patient was alert and appears not in acute distress.  Mood and affect are appropriate .  The patient's interactions are: Cooperative, makes eye contact, follows the instructions and answers questions coherently.  The patient is groomed and appropriately groomed and dressed. Head: Normocephalic, atraumatic.  Neck is supple. Mallampati: 5- small oral opening, no view. .  The neck circumference measured 20.5 inches. Nasal airflow was not fully patent ,   Underbite. Dental status: poor.   Cardiovascular:  Regular rate and cardiac rhythm by palpable pulse. Respiratory: no audible wheezing, no tachypnoea.   Skin:  Without evidence of ankle edema. No discoloration.  Trunk:  BMI is > 45 The patient's posture ; barrel chested, pycnic   Neurologic exam : The patient was awake and alert, oriented to place and time.   Attention span & concentration ability appeared normal.  Speech was fluent, with dysarthria, dysphonia -and of normal volume.     Cranial nerves:  There was no loss of  smell or taste reported . Right pupil reactive to right.  Funduscopic exam was deferred.  Hearing was impaired  to soft voice.    Facial sensation intact to fine touch.  Facial motor strength: Symmetric movement and tongue and uvula move midline.  Neck ROM: rotation,  tilt and flexion extension were intact for age and shoulder shrug was symmetrical.    Motor exam:  Symmetric bulk, strength and ROM.   Normal tone without cog- wheeling, and symmetric grip strength.   Sensory:  intact to vibration.  Coordination: The patient reported no problems with button closure and no changes to penmanship.   The Finger-to-nose maneuver was intact without evidence of ataxia, dysmetria or tremor.   Gait and station: Patient could rise unassisted from a seated position, without bracing, and walked without assistive device.  Wide based.  The patient turned with 3.5  steps. Toe and heel walk were deferred. No limp was noted.  Arm swing was preserved. The patient's gait posture was erect.  Deep tendon reflexes: Upper extremities did show symmetric DTRs. Lower extremity DTRs were attenuated.  ( Status post knee replacements, hip replacements and shoulder )  Babinski response was equivocal    I would like to thank Janey Santos, MD and Jama Manuelita RAMAN, Np [address] [place],  [place] [address] for allowing me to meet with Mr Shuler     1) This patient is a long time established CPAP patient in need of a new machine _Risk factors for OSA were persistently present,  including : , neck size and upper airway anatomy. He reports  hypersomnia.  He wears an eye-patch and will need a mask that prevents the surgical site from air leaks.  Orthopnea at home, BED 2  needed. HST and SPLIT study ordered.   2) Dizziness has resolved, no limp, no drift, normal posture and all improved after eye enucleation- I suspect that the  glaucoma and retinal infarct  let to  increased orbital pressure, which was causing vertigo,  he would lean forward, and fell forward.  Multiple falls but today none of these sensations was present,here today he has a normal gait exam.   3) he still needs to get used to monovision.    PS : Dry mouth due to urological meds. Lightheadedness due to HTN medication.    RV in 4-5 months for new CPAP visit with NP,    A total time of  45 minutes consistent of a part of face to face encounter , exam and interview,  and additional preparation time for chart review was spent .  At today's visit, we discussed treatment options, associated risk and benefits, and engage in counseling as needed including, but not limited to:  Sleep hygiene, Quality Sleep Habits, and Safety concerns for patients with daytime sleepiness who are warned to not operate machinery/ motor vehicles when drowsy. Risk factors for sleep apnea were identified:  Additionally, the following were reviewed: Past medical records, past medical and surgical history, family and social background, as well as relevant laboratory results, imaging findings, and medical notes, where applicable.  This note was generated by myself in part by using dictation software, and as a result, it may contain unintentional typos and errors.  Nevertheless, effort was made to accurately convey the pertinent aspects of the patient's visit.   Dedra Gores, MD  [facility] and [facility] Sleep Board certified in Sleep Medicine by The [employer] of Sleep Medicine and Diplomate of the [employer] of  Sleep Medicine (AASM) . Board certified In Neurology, Diplomat of the ABPN,  Fellow of the [employer] of Neurology.

## 2024-09-13 ENCOUNTER — Telehealth: Payer: Self-pay | Admitting: Neurology

## 2024-09-13 NOTE — Telephone Encounter (Signed)
 Split HTA pending

## 2024-09-24 NOTE — Telephone Encounter (Signed)
 Called HTA they informed me it it still in review.

## 2024-09-28 DIAGNOSIS — M1611 Unilateral primary osteoarthritis, right hip: Secondary | ICD-10-CM | POA: Diagnosis not present

## 2024-09-28 DIAGNOSIS — E1122 Type 2 diabetes mellitus with diabetic chronic kidney disease: Secondary | ICD-10-CM | POA: Diagnosis not present

## 2024-09-28 DIAGNOSIS — H34832 Tributary (branch) retinal vein occlusion, left eye, with macular edema: Secondary | ICD-10-CM | POA: Diagnosis not present

## 2024-09-28 DIAGNOSIS — R42 Dizziness and giddiness: Secondary | ICD-10-CM | POA: Diagnosis not present

## 2024-09-28 DIAGNOSIS — I129 Hypertensive chronic kidney disease with stage 1 through stage 4 chronic kidney disease, or unspecified chronic kidney disease: Secondary | ICD-10-CM | POA: Diagnosis not present

## 2024-09-28 DIAGNOSIS — Z23 Encounter for immunization: Secondary | ICD-10-CM | POA: Diagnosis not present

## 2024-09-28 DIAGNOSIS — J302 Other seasonal allergic rhinitis: Secondary | ICD-10-CM | POA: Diagnosis not present

## 2024-09-28 DIAGNOSIS — N1831 Chronic kidney disease, stage 3a: Secondary | ICD-10-CM | POA: Diagnosis not present

## 2024-09-28 DIAGNOSIS — F39 Unspecified mood [affective] disorder: Secondary | ICD-10-CM | POA: Diagnosis not present

## 2024-09-28 DIAGNOSIS — I4892 Unspecified atrial flutter: Secondary | ICD-10-CM | POA: Diagnosis not present

## 2024-09-28 DIAGNOSIS — E785 Hyperlipidemia, unspecified: Secondary | ICD-10-CM | POA: Diagnosis not present

## 2024-09-28 DIAGNOSIS — R350 Frequency of micturition: Secondary | ICD-10-CM | POA: Diagnosis not present

## 2024-10-08 NOTE — Telephone Encounter (Signed)
 Split HTA shara: [id number] (exp. 09/13/24 to 12/12/24) EE

## 2024-10-08 NOTE — Telephone Encounter (Signed)
 I called the patient he did not pick up. I left a voicemail with my direct number.

## 2024-10-15 NOTE — Telephone Encounter (Signed)
 I spoke with the patient wife.  He is scheduled at [facility] for 10/18/24 at 8 pm.  Sent mychart message but patient is aware to bring his Xanax to the study appointment.

## 2024-10-18 ENCOUNTER — Ambulatory Visit (INDEPENDENT_AMBULATORY_CARE_PROVIDER_SITE_OTHER): Admitting: Neurology

## 2024-10-18 DIAGNOSIS — R0902 Hypoxemia: Secondary | ICD-10-CM

## 2024-10-18 DIAGNOSIS — G4733 Obstructive sleep apnea (adult) (pediatric): Secondary | ICD-10-CM

## 2024-10-24 ENCOUNTER — Telehealth: Payer: Self-pay | Admitting: Neurology

## 2024-10-24 NOTE — Telephone Encounter (Signed)
 thanks

## 2024-10-24 NOTE — Telephone Encounter (Signed)
 Reviewed chart. Sleep study occurred on 10/18/24. This was done in order to get a new machine. Sleep study results are not available yet so the new machine has not been ordered. Once we have the results, will be able to move forward with ordering new machine. Please call wife back and have them wait for us  to call him with the next steps. He is to continue using his current cpap machine.

## 2024-10-24 NOTE — Telephone Encounter (Signed)
 Pt wife called to follow up about Pt CPAP machine. Pt wife stated that  she called DME and they informed Pt they are not sure how to set Pt up  . DME has not heard from MD office . Pt wife would Like to follow up with MD about CPAP

## 2024-10-25 ENCOUNTER — Telehealth: Payer: Self-pay

## 2024-10-25 NOTE — Telephone Encounter (Signed)
 error

## 2024-10-26 ENCOUNTER — Ambulatory Visit: Payer: Self-pay | Admitting: Neurology

## 2024-10-26 DIAGNOSIS — E662 Morbid (severe) obesity with alveolar hypoventilation: Secondary | ICD-10-CM

## 2024-10-26 DIAGNOSIS — G4733 Obstructive sleep apnea (adult) (pediatric): Secondary | ICD-10-CM

## 2024-10-26 DIAGNOSIS — R0902 Hypoxemia: Secondary | ICD-10-CM

## 2024-10-26 NOTE — Procedures (Signed)
 [facility] POLYSOMNOGRAPHY  INTERPRETATION REPORT/ Ordered was a SPLIT night    STUDY DATE:  10/18/2024     PATIENT NAME:  Tommy [name]         DATE OF BIRTH:  08-Mar-1941  PATIENT ID:  [id number]    TYPE OF STUDY:  PSG  SLEEP  PHYSICIAN: DEDRA GORES, MD REFERRED BY: Dr. Fredia Overcast, MD PhD SCORING TECHNICIAN: Delon Sprung, RPSGT   HISTORY:  This 83 year old Male patient was seen on 09-12-2024 and reported he is to be seen to get a new CPAP machine. Past medical history  : see Consultation. Loud snoring, OSA on CPAP, REM sleep PLMs, Obesity. This patient is a long time established CPAP patient in need of a new machine _Risk factors for OSA were persistently present, including: , neck size and upper airway anatomy. He reports hypersomnia. He wears an eye-patch and will need a mask that prevents the surgical site from air leaks. Orthopnea at home, BED 2 needed. HST and SPLIT study ordered. ? 2) Dizziness has resolved, no limp, no drift, normal posture and all improved after eye enucleation- I suspect that the glaucoma and retinal infarct let to increased orbital pressure, which was causing vertigo, he would lean forward and fell forward. Multiple falls but today none of these sensations was present today he has a normal gait exam.  he still needs to get used to monovision. ? RV in 4-5 months for new CPAP visit with NP,   He was prescribed a sleep aid to be taken to the test and orally taken in the sleep lab to allow more sleep time to be recorded (Xanax / alprazolam) as this study is ordered as a SPLIT NIGHT PSG.  ADDITIONAL INFORMATION:  The Epworth Sleepiness Scale was endorsed at 12 /24 points (scores above or equal to 10 are suggestive of hypersomnolence). Height: 65 in Weight: 279 lbs (BMI 46) Neck Size: 21 in  MEDICATIONS: too long to list   TECHNICAL DESCRIPTION: A registered sleep technologist ( RPSGT)  was in attendance for the duration of the  recording.  Data collection, scoring, video monitoring, and reporting were performed in compliance with the AASM Manual for the Scoring of Sleep and Associated Events; (Hypopnea is scored based on the criteria listed in Section VIII D. 1b in the AASM Manual V2.6 using a 4% oxygen  desaturation rule or Hypopnea is scored based on the criteria listed in Section VIII D. 1a in the AASM Manual V2.6 using 3% oxygen  desaturation and /or arousal rule).   SLEEP CONTINUITY AND SLEEP ARCHITECTURE:  Lights-out was at 21:15: and lights-on at  04:43:, with  7.5 hours of recording time . Total sleep time ( TST) was 260.5 minutes with a decreased sleep efficiency at 58.1%.  BODY POSITION:  TST was divided  between the following sleep positions: : supine 113 minutes (44%), non-supine 147 minutes (56%); right 125 minutes (48%), left 21 minutes (8%), and prone 00 minutes (0%). Total supine REM sleep time was 00 minutes (0% of total REM sleep).  Sleep latency was increased at 51.5 minutes.  REM sleep latency was increased at 296.5 minutes. Of the total sleep time, the percentage of stage N1 sleep was 15.7%, stage N2 sleep was 81%, stage N3 sleep was 0.0%. There was only  3.1% REM sleep, 54 awakenings (i.e. transitions to Stage W from any sleep stage), and 193 total stage transitions. Wake after sleep onset (WASO) time accounted for 136 minutes.   RESPIRATORY  MONITORING:   Based on CMS criteria (using a 4% oxygen  desaturation rule for scoring hypopneas), there were 148 apneas (148 obstructive; 0 central; 0 mixed), and 206 hypopneas.   The Apnea index was 34.1/h. Hypopnea index was 47.4/h. The AHI ( apnea-hypopnea index) was extremely high at 81.5/h overall (92.0 AHI/h  supine, 73/h in  non-supine; 67.5/h in  REM, and 82/h in NREM).  OXIMETRY: Oxyhemoglobin Saturation Nadir during sleep was at  60% from a mean of 90%.  Total sleep time (TST)   in hypoxemia (<89%) was 77.1 minutes, or 29.6% of total sleep time.  LIMB MOVEMENTS:  There were 101 periodic limb movements of sleep (23.3/hr), of which 29 (6.7/hr) were associated with an arousal. AROUSAL: There were 228 arousals in total, for an arousal index of 53 arousals/hour.  Of these, 206 were identified as respiratory-related arousals (47 /h), 29 were PLM-related arousals (7 /h), and 52 were non-specific arousals (12 /h). EKG: The electrocardiogram documented regular rhythm.  The average heart rate during sleep was 72 bpm.  The heart rate during sleep varied between a minimum of 69 bpm and  a maximum of  85 bpm. AUDIO and VIDEO:   Snoring was classified as loud . Loud sleep talking was captured. There was nearly constant movement in REM and NREM sleep.    IMPRESSION:   This study showed extreme sleep apnea to be present, hypopneas and obstructive apneas. AHI was 81.5/h.  There was also severe HYPOXIA noted, with a total hypoxia time of 77 minutes, 30% of the recorded sleep time.  Total sleep time was reduced at 260.5 minutes.  Sleep efficiency was decreased at 58.1%. I suspect Tommy [name] is CPAP dependent and therefor would have benefited greatly from  PAP to be provided.      RECOMMENDATIONS: Immediate return to CPAP therapy at last [facility]. I was unable to call up any previous sleep study and CPAP order from 2015 .  The patient will receive an auto-titration capable Res Med machine , humidified , set between 7 and 20 cm water with 3 cm EPR. he has to chose and name his mask of comfort and that model will be prescribed.  Rv had been scheduled after the in -office consultation. ( Tommy. Epps :Please bring the new CPAP machine and all attachments to the sleep clinic visit in circa 10 weeks with you, CD)    DEDRA GORES,  MD

## 2024-10-30 NOTE — Progress Notes (Signed)
  I just tried to call patient but VM was full.

## 2024-10-30 NOTE — Telephone Encounter (Signed)
 Spoke with patient and discussed his sleep study results. Pt verbalized understanding. He is aware to use machine at least 4 hours at night and f/u within 30-90 days after setup. Will leave current 02/14/25 appt for now but is subject to change if needed. Pt thanked me for the call.  Order for new machine sent to [facility].

## 2024-10-31 NOTE — Telephone Encounter (Signed)
 New, Adine Boring, Heather CROME, RN; Joylene Adine; Tucker, Dolanda; Ziegler, Melissa; Cain, Merilee Received, thanks

## 2024-11-06 DIAGNOSIS — H02135 Senile ectropion of left lower eyelid: Secondary | ICD-10-CM | POA: Diagnosis not present

## 2024-11-06 DIAGNOSIS — Q111 Other anophthalmos: Secondary | ICD-10-CM | POA: Diagnosis not present

## 2024-11-06 DIAGNOSIS — H401111 Primary open-angle glaucoma, right eye, mild stage: Secondary | ICD-10-CM | POA: Diagnosis not present

## 2024-11-07 DIAGNOSIS — H40011 Open angle with borderline findings, low risk, right eye: Secondary | ICD-10-CM | POA: Diagnosis not present

## 2024-12-04 ENCOUNTER — Telehealth: Payer: Self-pay | Admitting: Neurology

## 2024-12-04 NOTE — Telephone Encounter (Signed)
 Called pt and LVM stating that he is needing to schedule his Initial Cpap visit. DME in pt's SnapShot. Between dates are: 01/03/25-03/03/25

## 2024-12-05 NOTE — Telephone Encounter (Signed)
 Schedule Initial CPAP along with scheduled 4 month follow up. Contacted patient to confirm to bring CPAP machine and power cord to appointment.

## 2025-01-29 ENCOUNTER — Ambulatory Visit: Admitting: Neurology

## 2025-02-08 ENCOUNTER — Ambulatory Visit: Admitting: Cardiology

## 2025-02-14 ENCOUNTER — Ambulatory Visit: Admitting: Neurology
